# Patient Record
Sex: Male | Born: 1940
Health system: Southern US, Community
[De-identification: ages and names within clinical notes are randomized; demographics above are authoritative.]

## PROBLEM LIST (undated history)

## (undated) DIAGNOSIS — H269 Unspecified cataract: Secondary | ICD-10-CM

## (undated) DIAGNOSIS — E7800 Pure hypercholesterolemia, unspecified: Secondary | ICD-10-CM

## (undated) DIAGNOSIS — C61 Malignant neoplasm of prostate: Secondary | ICD-10-CM

## (undated) DIAGNOSIS — Z974 Presence of external hearing-aid: Secondary | ICD-10-CM

## (undated) DIAGNOSIS — D126 Benign neoplasm of colon, unspecified: Secondary | ICD-10-CM

## (undated) HISTORY — PX: PROSTATE SURGERY: SHX751

## (undated) HISTORY — PX: COLONOSCOPY: SHX174

## (undated) HISTORY — DX: Presence of external hearing-aid: Z97.4

## (undated) HISTORY — DX: Pure hypercholesterolemia, unspecified: E78.00

## (undated) HISTORY — DX: Benign neoplasm of colon, unspecified: D12.6

## (undated) HISTORY — PX: PROSTATE BIOPSY: SHX241

## (undated) HISTORY — PX: TONSILLECTOMY AND ADENOIDECTOMY: SUR1326

## (undated) HISTORY — PX: APPENDECTOMY: SHX54

## (undated) HISTORY — DX: Unspecified cataract: H26.9

## (undated) HISTORY — PX: CATARACT EXTRACTION: SUR2

## (undated) HISTORY — DX: Malignant neoplasm of prostate: C61

## (undated) HISTORY — PX: VASECTOMY: SHX75

---

## 1998-12-11 HISTORY — PX: LEG WOUND REPAIR / CLOSURE: SUR1143

## 2006-03-29 ENCOUNTER — Ambulatory Visit: Payer: Self-pay | Admitting: Gastroenterology

## 2006-04-12 ENCOUNTER — Encounter (INDEPENDENT_AMBULATORY_CARE_PROVIDER_SITE_OTHER): Payer: Self-pay | Admitting: Specialist

## 2006-04-12 ENCOUNTER — Ambulatory Visit: Payer: Self-pay | Admitting: Gastroenterology

## 2008-07-10 ENCOUNTER — Emergency Department (HOSPITAL_COMMUNITY): Admission: EM | Admit: 2008-07-10 | Discharge: 2008-07-10 | Payer: Self-pay | Admitting: Emergency Medicine

## 2008-07-17 ENCOUNTER — Encounter (HOSPITAL_COMMUNITY): Admission: RE | Admit: 2008-07-17 | Discharge: 2008-10-13 | Payer: Self-pay | Admitting: Emergency Medicine

## 2009-03-10 ENCOUNTER — Emergency Department (HOSPITAL_COMMUNITY): Admission: EM | Admit: 2009-03-10 | Discharge: 2009-03-10 | Payer: Self-pay | Admitting: Emergency Medicine

## 2009-03-22 ENCOUNTER — Encounter (INDEPENDENT_AMBULATORY_CARE_PROVIDER_SITE_OTHER): Payer: Self-pay | Admitting: *Deleted

## 2009-12-08 ENCOUNTER — Ambulatory Visit (HOSPITAL_COMMUNITY): Admission: RE | Admit: 2009-12-08 | Discharge: 2009-12-08 | Payer: Self-pay | Admitting: Gastroenterology

## 2011-06-08 ENCOUNTER — Encounter: Payer: Self-pay | Admitting: Gastroenterology

## 2011-06-29 ENCOUNTER — Encounter: Payer: Self-pay | Admitting: Gastroenterology

## 2011-07-20 ENCOUNTER — Ambulatory Visit (INDEPENDENT_AMBULATORY_CARE_PROVIDER_SITE_OTHER): Payer: Medicare Other | Admitting: Sports Medicine

## 2011-07-20 VITALS — BP 118/78 | Ht 72.0 in | Wt 175.0 lb

## 2011-07-20 DIAGNOSIS — M25561 Pain in right knee: Secondary | ICD-10-CM

## 2011-07-20 DIAGNOSIS — M25569 Pain in unspecified knee: Secondary | ICD-10-CM

## 2011-07-20 DIAGNOSIS — IMO0002 Reserved for concepts with insufficient information to code with codable children: Secondary | ICD-10-CM

## 2011-07-20 DIAGNOSIS — S83249A Other tear of medial meniscus, current injury, unspecified knee, initial encounter: Secondary | ICD-10-CM

## 2011-07-20 NOTE — Progress Notes (Signed)
  Subjective:    Patient ID: Parker Harrison, male    DOB: 1941/01/31, 70 y.o.   MRN: [id number]  HPI  Pt presents to clinic for evaluation of rt posterior knee pain x 4 days.   Pain started during a tennis match when he reached for a ball and twisted knee. Does yoga 2-3 times per week, and plays tennis 2-3 times per week. Pain has improved significantly since Tuesday.   He did not have locking swelling or giving way   Review of Systems     Objective:   Physical Exam  NAD Left knee Lacks 3 deg of extension Marked med tibial condyle hypertrophy No clicking on rotation No pain with flexion pinch Crepitation under patella and lateral side with flexion  Rt knee Full extension Normal contour Positive flexion pinch on rt, but has full flexion Crepitation on both joint lines with McMurray's  Crepitation under patella with flexion  No pain with palpation of HS or gastroc tendons on rt  MSK ultrasound On the right knee at the posterior horn of the medial meniscus there is a very small tear visualized best from the posterior view but also visualized on the joint line medially near the posterior fossa The lateral meniscus appears normal The patellar tendon and quad tendon appear normal There is no effusion      Assessment & Plan:

## 2011-07-20 NOTE — Assessment & Plan Note (Signed)
He will start some biking 3 days a week for quad strengthening rehabilitation  Okay to do you ago but avoid positions that cause extreme knee flexion which irritates the area of his meniscus injury  For tennis over the next 6 weeks I think he should try a compression sleeve on his knee to see if it is more comfortable in this limits the swelling  Recheck in 6 weeks unless symptom-free

## 2011-07-20 NOTE — Assessment & Plan Note (Signed)
He can use over-the-counter medications as needed as his pain seems pretty mild  Use ice after activity

## 2011-08-02 ENCOUNTER — Ambulatory Visit (AMBULATORY_SURGERY_CENTER): Payer: Medicare Other | Admitting: *Deleted

## 2011-08-02 ENCOUNTER — Encounter: Payer: Self-pay | Admitting: Gastroenterology

## 2011-08-02 VITALS — Ht 72.0 in | Wt 175.0 lb

## 2011-08-02 DIAGNOSIS — Z1211 Encounter for screening for malignant neoplasm of colon: Secondary | ICD-10-CM

## 2011-08-02 MED ORDER — SUPREP BOWEL PREP KIT 17.5-3.13-1.6 GM/177ML PO SOLN: 1.0000 | Freq: Once | ORAL | Status: DC

## 2011-08-16 ENCOUNTER — Ambulatory Visit (INDEPENDENT_AMBULATORY_CARE_PROVIDER_SITE_OTHER): Payer: Medicare Other | Admitting: Sports Medicine

## 2011-08-16 ENCOUNTER — Other Ambulatory Visit: Payer: Self-pay | Admitting: Gastroenterology

## 2011-08-16 ENCOUNTER — Encounter: Payer: Self-pay | Admitting: Sports Medicine

## 2011-08-16 VITALS — BP 127/55 | HR 58

## 2011-08-16 DIAGNOSIS — M25561 Pain in right knee: Secondary | ICD-10-CM

## 2011-08-16 DIAGNOSIS — S76119A Strain of unspecified quadriceps muscle, fascia and tendon, initial encounter: Secondary | ICD-10-CM

## 2011-08-16 DIAGNOSIS — S83249A Other tear of medial meniscus, current injury, unspecified knee, initial encounter: Secondary | ICD-10-CM

## 2011-08-16 DIAGNOSIS — IMO0002 Reserved for concepts with insufficient information to code with codable children: Secondary | ICD-10-CM

## 2011-08-16 DIAGNOSIS — S86819A Strain of other muscle(s) and tendon(s) at lower leg level, unspecified leg, initial encounter: Secondary | ICD-10-CM

## 2011-08-16 DIAGNOSIS — S838X9A Sprain of other specified parts of unspecified knee, initial encounter: Secondary | ICD-10-CM

## 2011-08-16 DIAGNOSIS — M25569 Pain in unspecified knee: Secondary | ICD-10-CM

## 2011-08-16 MED ORDER — NITROGLYCERIN 0.2 MG/HR TD PT24: MEDICATED_PATCH | TRANSDERMAL | Status: DC

## 2011-08-16 NOTE — Patient Instructions (Signed)
Start using 1/4 nitroglycerin patch on right knee daily. Please avoid playing tennis for the next month. Use bodyhelix knee sleeve when doing activity. Ok to walk as much as desired. Start using stationary bike to strengthen quad muscles. Start doing suggested knee exercises daily.

## 2011-08-16 NOTE — Assessment & Plan Note (Signed)
This seems to have improved and he is having less symptoms  I would encourage waiting on tennis for another 4 weeks until we recheck him.

## 2011-08-16 NOTE — Assessment & Plan Note (Signed)
He seems stable with over-the-counter medications and will continue these as needed

## 2011-08-16 NOTE — Progress Notes (Signed)
  Subjective:    Patient ID: Parker Harrison, male    DOB: 23-Oct-1941, 70 y.o.   MRN: [id number]  HPI  Pt presents to clinic for f/u of rt knee pain which he reports is about 80% improved.  Has decreased activity since last visit. Played tennis once, and did not have pain. Wore small bodyhelix full knee sleeve. After playing did have a knot appear on anterolateral rt knee which is improving, but has remained swollen. This was not painful but first night swelling was significant and has persisted.        Review of Systems     Objective:   Physical Exam NAD  Lt knee medial joint line spurring, lacks 5 deg full extension, 145 deg of flexion Clicking with Mcmurray's medially and laterally on lt Rt knee 135 deg flexion, -3 deg extension Lateral click on Mcmurray's on rt Less medial joint line hypertrophy on rt In seated position has swollen area 2x2 in at Rt distal vastus lateralis tendon just above patella Suprapatellar pouch slt swollen thessaly neg  MSK [place] Had stump sign and significant swelling of pouch under the vast lateralis on RT supratellar swelling crosses to midline and less on medial aspect Medial meniscus which showed some hypoechoic change and calcification in mid post are last visit looks more normal Post medial meniscus still shows some irregularity     Assessment & Plan:

## 2011-08-16 NOTE — Assessment & Plan Note (Addendum)
Start basic quad rehab biking or stationary biking 3 x week Walking OK qod Use compression sleeve - given medium

## 2011-09-14 ENCOUNTER — Ambulatory Visit (INDEPENDENT_AMBULATORY_CARE_PROVIDER_SITE_OTHER): Payer: Medicare Other | Admitting: Sports Medicine

## 2011-09-14 VITALS — BP 108/70

## 2011-09-14 DIAGNOSIS — M25561 Pain in right knee: Secondary | ICD-10-CM

## 2011-09-14 DIAGNOSIS — M25569 Pain in unspecified knee: Secondary | ICD-10-CM

## 2011-09-14 DIAGNOSIS — IMO0002 Reserved for concepts with insufficient information to code with codable children: Secondary | ICD-10-CM

## 2011-09-14 DIAGNOSIS — S83249A Other tear of medial meniscus, current injury, unspecified knee, initial encounter: Secondary | ICD-10-CM

## 2011-09-14 NOTE — Progress Notes (Signed)
  Subjective:    Patient ID: Parker Harrison, male    DOB: [birth year], 70 y.o.   MRN: [id number]  HPI  Parker Harrison is coming today to F/u on his R knee pain. He is 95% better. No pain with resting, doing yoga and spining without no pain. He has not played tennis yet. He has been using the nitro patch for 1 month without any side effects. Her denies any mechanical symptoms a s catching, locking or giving away.  Patient Active Problem List  Diagnoses  . Knee pain, right  . Tear of meniscus, knee, medial  . Quadriceps tendon rupture   Current Outpatient Prescriptions on File Prior to Visit  Medication Sig Dispense Refill  . glucosamine-chondroitin 500-400 MG tablet Take 2 tablets by mouth daily.        . nitroGLYCERIN (NITRODUR - DOSED IN MG/24 HR) 0.2 mg/hr Apply 1/4 patch to affected area daily.  Change patch every 24 hours.  30 patch  2  . SUPREP BOWEL PREP SOLN Take 1 kit by mouth once.  354 mL  0   No Known Allergies     Review of Systems  Constitutional: Negative for fever, chills, diaphoresis and fatigue.  Musculoskeletal: Negative for back pain, joint swelling and gait problem.  Neurological: Negative for tremors, weakness and numbness.       Objective:   Physical Exam  Constitutional: He is oriented to person, place, and time. He appears well-developed and well-nourished.       BP 108/70   Pulmonary/Chest: Effort normal.  Musculoskeletal:       Right knee with intact skin, FROM. Patellofemoral crepitus present with flexion and extension. Patellofemoral compression  test +. No tenderness on the quad neither or patellar tendon. TTP in the mid joint line. Ligaments intact. Lachman neg. Varus and valgus test at 0 and 30 degres neg Poor quad muscle definition.  Tight hamstrings Normal gait without a limp. Feet with mid arch.    Neurological: He is alert and oriented to person, place, and time.  Skin: Skin is warm. No rash noted. No erythema. No pallor.  Psychiatric: He  has a normal mood and affect. His behavior is normal.      MSK U/S : calcification of medial meniscus. Small joint effusion. Quad tendon looks wnl.     Assessment & Plan:    1. Tear of meniscus, knee, medial   2. Knee pain, right    Cont Nitro patch Quad exercise. Use knee sleeve playing tennis Start doing hitting drills in tennis F/U in 2 month

## 2011-09-19 ENCOUNTER — Encounter: Payer: Self-pay | Admitting: Gastroenterology

## 2011-09-19 ENCOUNTER — Ambulatory Visit (AMBULATORY_SURGERY_CENTER): Payer: Medicare Other | Admitting: Gastroenterology

## 2011-09-19 VITALS — BP 126/72 | HR 49 | Temp 96.6°F | Resp 20 | Ht 72.0 in | Wt 175.0 lb

## 2011-09-19 DIAGNOSIS — Z860100 Personal history of colon polyps, unspecified: Secondary | ICD-10-CM

## 2011-09-19 DIAGNOSIS — Z1211 Encounter for screening for malignant neoplasm of colon: Secondary | ICD-10-CM

## 2011-09-19 MED ORDER — SODIUM CHLORIDE 0.9 % IV SOLN: 500.0000 mL | INTRAVENOUS | Status: DC

## 2011-09-19 NOTE — Progress Notes (Signed)
Pt was uncomfortable with the scope advancement.  1mg  of versed given IV.  Pt's blood pressure 87/53 noted. Pt W,D,P and R.  Iv fliud wide open drip.  Maw   Blood pressure checked 106/69. VSS. maw

## 2011-09-19 NOTE — Patient Instructions (Signed)
RESUME ALL MEDICATIONS. D/C INFORMATION COMPLETED.

## 2011-09-20 ENCOUNTER — Telehealth: Payer: Self-pay | Admitting: *Deleted

## 2011-09-20 NOTE — Telephone Encounter (Signed)

## 2011-11-14 ENCOUNTER — Ambulatory Visit (INDEPENDENT_AMBULATORY_CARE_PROVIDER_SITE_OTHER): Payer: Medicare Other | Admitting: Sports Medicine

## 2011-11-14 DIAGNOSIS — M629 Disorder of muscle, unspecified: Secondary | ICD-10-CM

## 2011-11-14 DIAGNOSIS — M6289 Other specified disorders of muscle: Secondary | ICD-10-CM | POA: Insufficient documentation

## 2011-11-14 DIAGNOSIS — IMO0002 Reserved for concepts with insufficient information to code with codable children: Secondary | ICD-10-CM

## 2011-11-14 DIAGNOSIS — S83249A Other tear of medial meniscus, current injury, unspecified knee, initial encounter: Secondary | ICD-10-CM

## 2011-11-14 MED ORDER — NITROGLYCERIN 0.2 MG/HR TD PT24: MEDICATED_PATCH | TRANSDERMAL | Status: DC

## 2011-11-14 NOTE — Assessment & Plan Note (Signed)
Perform hamstring stretches.  Continue yoga

## 2011-11-14 NOTE — Patient Instructions (Signed)
Try a half patch on your right knee.  Work on hamstring mobility and stretching.  Your meniscus has healed up well, and may not get better than it is now.

## 2011-11-14 NOTE — Assessment & Plan Note (Signed)
Healing well. May be at maximum healing level. Continue yoga and other stretching exercises for hamstrings.

## 2011-11-14 NOTE — Progress Notes (Signed)
  Subjective:    Patient ID: Parker Harrison, male    DOB: Jun 07, 1941, 70 y.o.   MRN: [id number]  HPI  Mr.Crays is coming today to F/u on his R knee pain. He is 95% better. No pain with resting, doing yoga and body pump without pain. He is playing tennis. He has been using half of the nitro patch for 1 month without any side effects. He denies any mechanical symptoms such as catching, locking or giving out.  Patient Active Problem List  Diagnoses  . Knee pain, right  . Tear of meniscus, knee, medial  . Quadriceps tendon rupture  . Hamstring tightness   Current Outpatient Prescriptions on File Prior to Visit  Medication Sig Dispense Refill  . glucosamine-chondroitin 500-400 MG tablet Take 2 tablets by mouth daily.         No Known Allergies  Review of Systems  Constitutional: Negative for fever, chills, diaphoresis and fatigue.  Musculoskeletal: Negative for back pain, joint swelling and gait problem.  Neurological: Negative for tremors, weakness and numbness.      Objective:   Physical Exam  Constitutional: He is oriented to person, place, and time. He appears well-developed and well-nourished.       BP 108/70   Pulmonary/Chest: Effort normal.  Musculoskeletal:       Right knee with intact skin, FROM. Patellofemoral crepitus present with flexion and extension. Patellofemoral compression  test +. No tenderness on the quad neither or patellar tendon. TTP in the mid joint line. Ligaments intact. Lachman neg. Varus and valgus test at 0 and 30 degres neg Poor quad muscle definition.  Tight hamstrings Normal gait without a limp. Feet with mid arch.    Neurological: He is alert and oriented to person, place, and time.  Skin: Skin is warm. No rash noted. No erythema. No pallor.  Psychiatric: He has a normal mood and affect. His behavior is normal.   MSK U/S 10/12 : calcification of medial meniscus. Small joint effusion. Quad tendon looks wnl.     Assessment & Plan:   1. Tear  of meniscus, knee, medial   2. Hamstring tightness    Cont Nitro patch Quad exercise. Use knee sleeve playing tennis Start doing hitting drills in tennis F/U prn

## 2011-11-30 ENCOUNTER — Ambulatory Visit: Payer: Self-pay

## 2012-02-12 ENCOUNTER — Ambulatory Visit (INDEPENDENT_AMBULATORY_CARE_PROVIDER_SITE_OTHER): Payer: Medicare Other | Admitting: Sports Medicine

## 2012-02-12 VITALS — BP 120/70

## 2012-02-12 DIAGNOSIS — IMO0002 Reserved for concepts with insufficient information to code with codable children: Secondary | ICD-10-CM

## 2012-02-12 DIAGNOSIS — S83249A Other tear of medial meniscus, current injury, unspecified knee, initial encounter: Secondary | ICD-10-CM

## 2012-02-12 DIAGNOSIS — M6289 Other specified disorders of muscle: Secondary | ICD-10-CM

## 2012-02-12 DIAGNOSIS — M629 Disorder of muscle, unspecified: Secondary | ICD-10-CM

## 2012-02-12 NOTE — Assessment & Plan Note (Signed)
This is stable  He can follow up prn for this  Playing tennis w no knee pain

## 2012-02-12 NOTE — Progress Notes (Signed)
  Subjective:    Patient ID: Parker Harrison, male    DOB: [birth year], 71 y.o.   MRN: [id number]  HPI  71 y/o male is here for follow up for right knee pain secondary to a calcified meniscus.  This pain is completely resolved.  He is also following up for hamstring pain and tightness.  He has no problems with his hamstring except when playing tennis.  He can do group exercise classes without difficulty but on the tennis court he feels like he doesn't have a spring in his step.  He gets pain at the distal hamstring during the game that is worse with flexion.   Review of Systems     Objective:   Physical Exam  Hips pain equal free rotation bilaterally  The right hamstring tendon is more taught than the left  when he is lying prone Hamstring strength is normal with foot in neutral, everted, and inverted and there is no pain with resistance No tenderness to palpation at the proximal or distal hamstring tendons No tenderness to palpation in the muscle belly No masses       Assessment & Plan:

## 2012-02-12 NOTE — Assessment & Plan Note (Signed)
Given improved stretching regimen by Dr Laural Benes He was not doing these correctly  He will work daily stretching Plans to try rolfing with Cammy Copa Use knee sleeve over area of HS tightness  Reck pending response to this approach

## 2012-03-18 ENCOUNTER — Other Ambulatory Visit: Payer: Self-pay | Admitting: Dermatology

## 2012-08-01 ENCOUNTER — Encounter: Payer: Self-pay | Admitting: Family Medicine

## 2012-08-01 ENCOUNTER — Ambulatory Visit (INDEPENDENT_AMBULATORY_CARE_PROVIDER_SITE_OTHER): Payer: Medicare Other | Admitting: Family Medicine

## 2012-08-01 VITALS — BP 113/68 | HR 63 | Ht 72.0 in | Wt 180.0 lb

## 2012-08-01 DIAGNOSIS — M25562 Pain in left knee: Secondary | ICD-10-CM

## 2012-08-01 DIAGNOSIS — M25569 Pain in unspecified knee: Secondary | ICD-10-CM

## 2012-08-05 ENCOUNTER — Encounter: Payer: Self-pay | Admitting: Family Medicine

## 2012-08-05 DIAGNOSIS — M25562 Pain in left knee: Secondary | ICD-10-CM | POA: Insufficient documentation

## 2012-08-05 NOTE — Assessment & Plan Note (Signed)
Patient's knee exam is benign.  All meniscus testing negative.  He describes knee locking in full extension, NOT locking in flexion as would be expected with a loose body or bucket handle type meniscal tear.  Most likely due to popliteal spasms but discussed if this does not improving with ROM exercises/stretches, nsaids would advise returning for reevaluation for possible MRI to assess for meniscal tear causing mechanical symptoms.

## 2012-08-05 NOTE — Progress Notes (Signed)
  Subjective:    Patient ID: Parker Harrison, male    DOB: May 06, 1941, 71 y.o.   MRN: [id number]  PCP: Dr. Perrin Maltese  HPI 71 yo M here for left knee pain.  Patient reports this morning while doing yoga his left knee felt like it locked in extension. Now feels like he can only bend about 90 degrees. No swelling or bruising. Just started today while in downward dog position. Took aleve earlier which helped. No prior locking, catching, giving out. Injured this knee 12+ years ago when had a laceration. Seen in [place] office for right knee pain issues 2/2 calcified meniscus.  Past Medical History  Diagnosis Date  . Adenomatous polyp of colon   . Hearing aid worn     No current outpatient prescriptions on file prior to visit.    Past Surgical History  Procedure Date  . Tonsillectomy and adenoidectomy   . Colonoscopy     No Known Allergies  History   Social History  . Marital Status: Married    Spouse Name: N/A    Number of Children: N/A  . Years of Education: N/A   Occupational History  . Not on file.   Social History Main Topics  . Smoking status: Never Smoker   . Smokeless tobacco: Never Used  . Alcohol Use: 4.8 oz/week    8 Glasses of wine per week  . Drug Use: No  . Sexually Active: Not on file   Other Topics Concern  . Not on file   Social History Narrative  . No narrative on file    Family History  Problem Relation Age of Onset  . Sudden death Neg Hx   . Hypertension Neg Hx   . Hyperlipidemia Neg Hx   . Heart attack Neg Hx   . Diabetes Neg Hx     BP 113/68  Pulse 63  Ht 6' (1.829 m)  Wt 180 lb (81.647 kg)  BMI 24.41 kg/m2  Review of Systems See HPI above.    Objective:   Physical Exam Gen: NAD  L knee: No gross deformity, ecchymoses, effusion. No TTP. ROM 0 -100 comfortably. Negative ant/post drawers. Negative valgus/varus testing. Negative lachmanns. Negative mcmurrays, apleys, patellar apprehension, clarkes, thessalys,  sit-home. NV intact distally.  R knee: FROM without pain, instability.    Assessment & Plan:  1. Left knee pain - Patient's knee exam is benign.  All meniscus testing negative.  He describes knee locking in full extension, NOT locking in flexion as would be expected with a loose body or bucket handle type meniscal tear.  Most likely due to popliteal spasms but discussed if this does not improving with ROM exercises/stretches, nsaids would advise returning for reevaluation for possible MRI to assess for meniscal tear causing mechanical symptoms.

## 2012-09-23 ENCOUNTER — Other Ambulatory Visit: Payer: Self-pay | Admitting: Dermatology

## 2012-11-22 ENCOUNTER — Encounter: Payer: Self-pay | Admitting: Family Medicine

## 2012-11-22 ENCOUNTER — Ambulatory Visit (INDEPENDENT_AMBULATORY_CARE_PROVIDER_SITE_OTHER): Payer: Medicare Other | Admitting: Family Medicine

## 2012-11-22 VITALS — BP 135/76 | HR 53 | Temp 97.6°F | Resp 16 | Ht 71.5 in | Wt 183.0 lb

## 2012-11-22 DIAGNOSIS — Z860100 Personal history of colon polyps, unspecified: Secondary | ICD-10-CM | POA: Insufficient documentation

## 2012-11-22 DIAGNOSIS — Z0000 Encounter for general adult medical examination without abnormal findings: Secondary | ICD-10-CM

## 2012-11-22 DIAGNOSIS — Z23 Encounter for immunization: Secondary | ICD-10-CM

## 2012-11-22 DIAGNOSIS — Z125 Encounter for screening for malignant neoplasm of prostate: Secondary | ICD-10-CM

## 2012-11-22 LAB — POCT URINALYSIS DIPSTICK
Ketones, UA: NEGATIVE
Leukocytes, UA: NEGATIVE
Protein, UA: NEGATIVE
pH, UA: 5.5

## 2012-11-22 LAB — COMPREHENSIVE METABOLIC PANEL
ALT: 25 U/L (ref 0–53)
BUN: 18 mg/dL (ref 6–23)
CO2: 28 mEq/L (ref 19–32)
Calcium: 10 mg/dL (ref 8.4–10.5)
Creat: 1.01 mg/dL (ref 0.50–1.35)
Total Bilirubin: 0.8 mg/dL (ref 0.3–1.2)

## 2012-11-22 LAB — LIPID PANEL
Cholesterol: 256 mg/dL — ABNORMAL HIGH (ref 0–200)
HDL: 60 mg/dL (ref >39)
Total CHOL/HDL Ratio: 4.3 Ratio
Triglycerides: 82 mg/dL (ref <150)
VLDL: 16 mg/dL (ref 0–40)

## 2012-11-22 NOTE — Progress Notes (Signed)
Subjective:    Patient ID: Parker Harrison, male    DOB: [birth year], 71 y.o.   MRN: [id number]  HPI  This healthy 71 y.o. Cauc male is here for annual [facility] Wellness exam. He has minimal  physical complaints and stays physically active w/ tennis and other activities. He stays sharp  mentally by playing bridge.    Review of Systems  HENT: Positive for hearing loss.        Pt has bilat hearing aids.  All other systems reviewed and are negative.       Objective:   Physical Exam  Nursing note and vitals reviewed. Constitutional: He is oriented to person, place, and time. He appears well-developed and well-nourished. No distress.  HENT:  Head: Normocephalic and atraumatic.  Right Ear: Tympanic membrane, external ear and ear canal normal. Decreased hearing is noted.  Left Ear: Tympanic membrane, external ear and ear canal normal. Decreased hearing is noted.  Nose: No mucosal edema, rhinorrhea, nasal deformity or septal deviation. Right sinus exhibits no maxillary sinus tenderness and no frontal sinus tenderness. Left sinus exhibits no maxillary sinus tenderness and no frontal sinus tenderness.  Mouth/Throat: Uvula is midline, oropharynx is clear and moist and mucous membranes are normal. No oral lesions. Normal dentition. No dental caries.  Eyes: Conjunctivae normal, EOM and lids are normal. Pupils are equal, round, and reactive to light. No scleral icterus.  Fundoscopic exam:      The right eye shows no papilledema. The right eye shows red reflex.      The left eye shows no papilledema. The left eye shows red reflex.      Vision care by eye care professional.  Neck: Normal range of motion. Neck supple. No JVD present. No thyromegaly present.  Cardiovascular: Normal rate, regular rhythm, normal heart sounds and intact distal pulses.  Exam reveals no gallop and no friction rub.   No murmur heard. Pulmonary/Chest: Effort normal and breath sounds normal. No respiratory distress. He has no  wheezes. He exhibits no tenderness.  Abdominal: Soft. Bowel sounds are normal. He exhibits no distension, no abdominal bruit, no pulsatile midline mass and no mass. There is no hepatosplenomegaly. There is no tenderness. There is no guarding and no CVA tenderness. No hernia. Hernia confirmed negative in the right inguinal area and confirmed negative in the left inguinal area.  Genitourinary: Testes normal and penis normal. Rectal exam shows external hemorrhoid. Rectal exam shows no fissure, no mass, no tenderness and anal tone normal. Guaiac negative stool. Prostate is enlarged. Prostate is not tender. Right testis shows no mass, no swelling and no tenderness. Right testis is descended. Left testis shows no mass, no swelling and no tenderness. Left testis is descended.       Prostate gland with mild enlargement of right lobe; firm consistency but no discrete nodule palpable.  Musculoskeletal: Normal range of motion. He exhibits no edema and no tenderness.  Lymphadenopathy:    He has no cervical adenopathy.       Right: No inguinal adenopathy present.       Left: No inguinal adenopathy present.  Neurological: He is alert and oriented to person, place, and time. He has normal reflexes. No cranial nerve deficit. He exhibits normal muscle tone. Coordination normal.  Skin: Skin is warm and dry. No rash noted. No erythema. No pallor.  Psychiatric: He has a normal mood and affect. His behavior is normal. Judgment and thought content normal.  Assessment & Plan:   1. Routine general medical examination at a health care facility  POCT urinalysis dipstick Comprehensive metabolic panel, Lipid panel, IFOBT POC (occult bld, rslt in office) Discussed treatment of elevated cholesterol (pt's preference is not to take statins but to practice healthy nutrition and lifestyle).  2. Special screening for malignant neoplasm of prostate  PSA, Medicare  3. Personal history of colonic polyps  CRS due in 2017.   4. Need for influenza vaccination  Flu vaccine greater than or equal to 3yo preservative free IM

## 2012-11-22 NOTE — Patient Instructions (Addendum)
Keeping you healthy  Get these tests  Blood pressure- Have your blood pressure checked once a year by your healthcare provider.  Normal blood pressure is 120/80  Weight- Have your body mass index (BMI) calculated to screen for obesity.  BMI is a measure of body fat based on height and weight. You can also calculate your own BMI at [email or web address].  Cholesterol- Have your cholesterol checked every year.  Diabetes- Have your blood sugar checked regularly if you have high blood pressure, high cholesterol, have a family history of diabetes or if you are overweight.  Screening for Colon Cancer- Colonoscopy starting at age 82.  Screening may begin sooner depending on your family history and other health conditions. Follow up colonoscopy as directed by your Gastroenterologist.  Screening for Prostate Cancer- Both blood work (PSA) and a rectal exam help screen for Prostate Cancer.  Screening begins at age 37 with African-American men and at age 22 with Caucasian men.  Screening may begin sooner depending on your family history.  Take these medicines  Aspirin- One aspirin daily can help prevent Heart disease and Stroke.  Flu shot- Every fall.  Tetanus- Every 10 years.  Tdap is up-to-date- next Tetanus is due in 2017.  Zostavax- Once after the age of 55 to prevent Shingles. You had this vaccine on 12/20/2010.  Pneumonia shot- Once after the age of 40; if you are younger than 1, ask your healthcare provider if you need a Pneumonia shot.  You had this vaccine on 07/20/2008.  Take these steps  Don't smoke- If you do smoke, talk to your doctor about quitting.  For tips on how to quit, go to www.smokefree.gov or call 1-800-QUIT-NOW.  Be physically active- Exercise 5 days a week for at least 30 minutes.  If you are not already physically active start slow and gradually work up to 30 minutes of moderate physical activity.  Examples of moderate activity include walking briskly, mowing the yard,  dancing, swimming, bicycling, etc.  Eat a healthy diet- Eat a variety of healthy food such as fruits, vegetables, low fat milk, low fat cheese, yogurt, lean meant, poultry, fish, beans, tofu, etc. For more information go to www.thenutritionsource.org  Drink alcohol in moderation- Limit alcohol intake to less than two drinks a day. Never drink and drive.  Dentist- Brush and floss twice daily; visit your dentist twice a year.  Depression- Your emotional health is as important as your physical health. If you're feeling down, or losing interest in things you would normally enjoy please talk to your healthcare provider.  Eye exam- Visit your eye doctor every year.  Safe sex- If you may be exposed to a sexually transmitted infection, use a condom.  Seat belts- Seat belts can save your life; always wear one.  Smoke/Carbon Monoxide detectors- These detectors need to be installed on the appropriate level of your home.  Replace batteries at least once a year.  Skin cancer- When out in the sun, cover up and use sunscreen 15 SPF or higher.  Violence- If anyone is threatening you, please tell your healthcare provider.  Living Will/ Health care power of attorney- Speak with your healthcare provider and family.   There is some blood in your urine but this has been evaluated in the past.  Some nutrition additions that would be beneficial_  A good multi-vitamin like Centrum Silver with Vitamin D. Add some other nuts like pumpkin seeds and almonds to your daily snacking routine. Stay mentally and physically active and you  will continue to do well!!!    You are doing a great job staying healthy! Thank you!!!

## 2012-11-24 ENCOUNTER — Encounter: Payer: Self-pay | Admitting: Family Medicine

## 2012-11-24 DIAGNOSIS — E7800 Pure hypercholesterolemia, unspecified: Secondary | ICD-10-CM | POA: Insufficient documentation

## 2012-11-26 ENCOUNTER — Other Ambulatory Visit: Payer: Self-pay | Admitting: Family Medicine

## 2012-11-26 ENCOUNTER — Encounter: Payer: Self-pay | Admitting: Family Medicine

## 2012-11-26 DIAGNOSIS — R972 Elevated prostate specific antigen [PSA]: Secondary | ICD-10-CM

## 2012-11-26 NOTE — Telephone Encounter (Signed)
I placed an order for this pt to see Dr. Alfredo Martinez at [facility] re: elevated PSA; the pt requests referral to Dr. Marcine Matar at [facility]. Please make that change. Thank you.

## 2013-02-18 ENCOUNTER — Encounter: Payer: Self-pay | Admitting: Family Medicine

## 2013-02-18 DIAGNOSIS — C61 Malignant neoplasm of prostate: Secondary | ICD-10-CM | POA: Insufficient documentation

## 2013-03-20 ENCOUNTER — Ambulatory Visit: Payer: Medicare Other

## 2013-03-20 ENCOUNTER — Ambulatory Visit: Payer: Medicare Other | Admitting: Radiation Oncology

## 2013-03-26 ENCOUNTER — Encounter: Payer: Self-pay | Admitting: Oncology

## 2013-03-27 ENCOUNTER — Telehealth: Payer: Self-pay | Admitting: Radiation Oncology

## 2013-03-27 ENCOUNTER — Ambulatory Visit
Admission: RE | Admit: 2013-03-27 | Discharge: 2013-03-27 | Disposition: A | Discharge status: Home / Self Care | Payer: Medicare Other | Source: Ambulatory Visit | Attending: Radiation Oncology | Admitting: Radiation Oncology

## 2013-03-27 ENCOUNTER — Encounter: Payer: Self-pay | Admitting: Radiation Oncology

## 2013-03-27 VITALS — BP 127/65 | HR 71 | Temp 97.7°F | Ht 71.5 in | Wt 181.9 lb

## 2013-03-27 DIAGNOSIS — C61 Malignant neoplasm of prostate: Secondary | ICD-10-CM | POA: Insufficient documentation

## 2013-03-27 DIAGNOSIS — Z8042 Family history of malignant neoplasm of prostate: Secondary | ICD-10-CM | POA: Insufficient documentation

## 2013-03-27 DIAGNOSIS — E7800 Pure hypercholesterolemia, unspecified: Secondary | ICD-10-CM | POA: Insufficient documentation

## 2013-03-27 NOTE — Progress Notes (Signed)
GU Location of Tumor / Histology: prostate cancer   If Prostate Cancer, Gleason Score is (3 + 3=6) and PSA is (5.07) and prostate volume is 29.37 cc   Patient presented 4  months ago with signs/symptoms of elevated PSA  Biopsies of prostate (if applicable) revealed: adenocarcinoma of the prostate 7 areas   Past/Anticipated interventions by urology, if any: prostate biopsy   Past/Anticipated interventions by medical oncology, if any: none   Weight changes, if any: none Bowel/Bladder complaints, if any: none Nausea/Vomiting, if any: none Pain issues, if any: none  SAFETY ISSUES:  Prior radiation? no  Pacemaker/ICD? no Possible current pregnancy? no  Is the patient on methotrexate? No  Current Complaints / other details: Parker Harrison here for consult with his wife.  He denies pain and fatigue.  He does stop and start several times when urinating.

## 2013-03-27 NOTE — Progress Notes (Signed)
[facility] Radiation Oncology NEW PATIENT EVALUATION  Name: Parker Harrison MRN: [id number]  Date:   03/27/2013           DOB: [birth year]  Status: outpatient   CC: GUEST, Loretha Stapler, MD  Marcine Matar, MD    REFERRING PHYSICIAN: Marcine Matar, MD   DIAGNOSIS: Stage TI C. favorable risk adenocarcinoma prostate   HISTORY OF PRESENT ILLNESS:  Parker Harrison is a 72 y.o. male who is seen today for the courtesy of Dr. Retta Diones for discussion of possible radiation therapy in the management of his stage TI C. favorable risk adenocarcinoma prostate. He has had a slowly rising PSA since January 2002. He had been undergoing screening because of a positive family history of prostate cancer in his father and brother. His PSA in July 2010 was 2.7, November of 2001, 3.32, November 2012, 3.97, and in December 2013,  5.07. He was seen by Dr. to her Dahlstedt who performed ultrasound-guided biopsies on 02/14/2013. He was found to have central biopsies containing Gleason 6 (3+3) adenocarcinoma. He had 5% of one core from right lateral base, less than 5% of one core from the right mid gland, 5% of one core from the left lateral base, 10% of one core from the left lateral mid gland, 30% of one core from the left mid gland, less than 5% of one core from left lateral apex and 10% of one core from left apex. His gland volume was approximately 29.4 cc. He is doing well from a GU and GI standpoint. His I PSS score 6. He is potent.  PREVIOUS RADIATION THERAPY: No   PAST MEDICAL HISTORY:  has a past medical history of Adenomatous polyp of colon; Hearing aid worn; Hypercholesterolemia; Prostate cancer; and Elevated PSA (11/22/2012).     PAST SURGICAL HISTORY:  Past Surgical History  Procedure Laterality Date  . Tonsillectomy and adenoidectomy    . Colonoscopy    . Vasectomy    . Knee surgery Left   . Leg wound repair / closure Left   . Prostate biopsy       FAMILY HISTORY: family  history includes Dementia in his mother and Prostate cancer in his brother and father.  There is no history of Sudden death, and Hypertension, and Hyperlipidemia, and Heart attack, and Diabetes, . His father had prostate cancer and died at age 61. His mother died from "old age" at 16. One brother was diagnosed with prostate cancer in his 48s.   SOCIAL HISTORY:  reports that he has never smoked. He has never used smokeless tobacco. He reports that he drinks about 2.4 ounces of alcohol per week. He reports that he does not use illicit drugs. Married, 2 children. 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   ALLERGIES: Review of patient's allergies indicates no known allergies.   MEDICATIONS:  Current Outpatient Prescriptions  Medication Sig Dispense Refill  . naproxen sodium (ANAPROX) 220 MG tablet Take 220 mg by mouth 2 (two) times daily with a meal.       No current facility-administered medications for this encounter.     REVIEW OF SYSTEMS:  Pertinent items are noted in HPI.    PHYSICAL EXAM:  height is 5' 11.5" (1.816 m) and weight is 181 lb 14.4 oz (82.509 kg). His temperature is 97.7 F (36.5 C). His blood pressure is 127/65 and his pulse is 71.   Alert, oriented, well-developed 72 year old white male appearing much younger  than his stated age. Head and neck examination: Grossly unremarkable. Nodes: Without palpable cervical or supraclavicular lymphadenopathy. Chest: Lungs clear. Back: Without spinal or CVA tenderness. Abdomen: Without masses organomegaly. Genitalia: Unremarkable to inspection. Rectal: His prostate gland is normal in size and is without focal induration or nodularity. Extremities without edema. Neurologic examination: Rosen nonfocal.   LABORATORY DATA:  No results found for this basename: WBC, HGB, HCT, MCV, PLT   Lab Results  Component Value Date   NA 137 11/22/2012   K 4.3 11/22/2012   CL 101 11/22/2012   CO2 28 11/22/2012    Lab Results  Component Value Date   ALT 25 11/22/2012   AST 26 11/22/2012   ALKPHOS 76 11/22/2012   BILITOT 0.8 11/22/2012   PSA 5.07 from 11/22/2012.   IMPRESSION: Stage TI C. favorable risk adenocarcinoma prostate. I explained to the patient and his wife that his prognosis related to his stage, PSA level, and Gleason score. All are favorable. Management options include a robotic prostatectomy, close surveillance, or radiation therapy. Radiation therapy options include seed implantation or 8 weeks of external beam/IMRT. He is rather fit, and there is longevity in his family. I believe that he would be an excellent candidate for either surgery, based on his young physiologic age, or radiation therapy. We discussed seed implantation and also IMRT in great detail. We discussed the potential acute and late toxicities of radiation therapy. His prognosis is excellent. He may want to speak with one of the robotic surgeons if he is undecided about radiation therapy. He'll think things over and get back in touch with me if he would like to consider either radiation therapy option. He would be an ideal candidate for seed implantation.   PLAN: As discussed above.   I spent 60 minutes minutes face to face with the patient and more than 50% of that time was spent in counseling and/or coordination of care.

## 2013-03-27 NOTE — Progress Notes (Signed)
Please see the Nurse Progress Note in the MD Initial Consult Encounter for this patient. 

## 2013-03-27 NOTE — Telephone Encounter (Signed)
Met w patient today to discuss RO billing. Pt wanted to know the pricing of the prostate surgery and prostate seed implant and was given a rough estimate of $20 to $25 thousand dollars.   Pt has [facility] plan and as of date has met the following below:  OOP Max $4,900.00  Met YTD $211.77  Remaining BAL $0,981.19  Attending Rad:  RM

## 2013-03-28 ENCOUNTER — Telehealth: Payer: Self-pay | Admitting: *Deleted

## 2013-03-28 NOTE — Addendum Note (Signed)
Encounter addended by: Cordia Miklos Mintz Abia Monaco, RN on: 03/28/2013  7:14 PM<BR>     Documentation filed: Charges VN

## 2013-03-28 NOTE — Telephone Encounter (Signed)
CALLED PATIENT TO INFORM OF IMPLANT DATE, SPOKE WITH PATIENT AND HE IS AWARE OF HIS IMPLANT DATE 

## 2013-04-01 ENCOUNTER — Ambulatory Visit: Payer: Medicare Other

## 2013-04-01 ENCOUNTER — Ambulatory Visit: Payer: Medicare Other | Admitting: Radiation Oncology

## 2013-04-02 ENCOUNTER — Telehealth: Payer: Self-pay | Admitting: *Deleted

## 2013-04-02 ENCOUNTER — Other Ambulatory Visit: Payer: Self-pay | Admitting: Radiation Oncology

## 2013-04-02 NOTE — Telephone Encounter (Signed)
CALLED PATIENT TO GIVE INFO., LVM FOR A RETURN CALL 

## 2013-04-02 NOTE — Telephone Encounter (Signed)
xxxx 

## 2013-04-04 ENCOUNTER — Telehealth: Payer: Self-pay | Admitting: *Deleted

## 2013-04-04 NOTE — Telephone Encounter (Signed)
Called patient to ask question, lvm for a return call 

## 2013-04-08 ENCOUNTER — Other Ambulatory Visit: Payer: Self-pay | Admitting: Urology

## 2013-04-09 ENCOUNTER — Telehealth: Payer: Self-pay | Admitting: *Deleted

## 2013-04-09 NOTE — Telephone Encounter (Signed)
CALLED PATIENT TO REMIND OF APPT. FOR 04-10-13, LVM FOR A RETURN CALL

## 2013-04-10 ENCOUNTER — Encounter (HOSPITAL_BASED_OUTPATIENT_CLINIC_OR_DEPARTMENT_OTHER)
Admission: RE | Admit: 2013-04-10 | Discharge: 2013-04-10 | Disposition: A | Discharge status: Home / Self Care | Payer: Medicare Other | Source: Ambulatory Visit | Attending: Urology | Admitting: Urology

## 2013-04-10 ENCOUNTER — Other Ambulatory Visit: Payer: Self-pay

## 2013-04-10 ENCOUNTER — Ambulatory Visit
Admission: RE | Admit: 2013-04-10 | Discharge: 2013-04-10 | Disposition: A | Discharge status: Home / Self Care | Payer: Medicare Other | Source: Ambulatory Visit | Attending: Radiation Oncology | Admitting: Radiation Oncology

## 2013-04-10 ENCOUNTER — Ambulatory Visit (HOSPITAL_BASED_OUTPATIENT_CLINIC_OR_DEPARTMENT_OTHER)
Admission: RE | Admit: 2013-04-10 | Discharge: 2013-04-10 | Disposition: A | Discharge status: Home / Self Care | Payer: Medicare Other | Source: Ambulatory Visit | Attending: Urology | Admitting: Urology

## 2013-04-10 DIAGNOSIS — C61 Malignant neoplasm of prostate: Secondary | ICD-10-CM | POA: Insufficient documentation

## 2013-04-10 DIAGNOSIS — Z01818 Encounter for other preprocedural examination: Secondary | ICD-10-CM | POA: Insufficient documentation

## 2013-04-10 DIAGNOSIS — R091 Pleurisy: Secondary | ICD-10-CM | POA: Insufficient documentation

## 2013-04-10 NOTE — Progress Notes (Signed)
   Complex simulation/treatment planning note: The patient was taken to the CT simulator. He was placed supine. His pelvis was scanned. The CT data set was sent to the treatment planning system. I contoured his prostate and projected the prostate over his pubic arch. The arch is open. His prostate volume is 34.4 cc with a prostatic length of 3.07 cm. I prescribing 14,500 cGy utilizing I-125 seeds. He is to be implanted with the [employer] system.

## 2013-05-14 ENCOUNTER — Encounter: Payer: Self-pay | Admitting: Internal Medicine

## 2013-05-21 ENCOUNTER — Telehealth: Payer: Self-pay | Admitting: *Deleted

## 2013-05-21 NOTE — Telephone Encounter (Signed)
Called patient to ask question, lvm for a return call 

## 2013-05-22 ENCOUNTER — Encounter (HOSPITAL_BASED_OUTPATIENT_CLINIC_OR_DEPARTMENT_OTHER): Payer: Self-pay | Admitting: *Deleted

## 2013-05-22 LAB — COMPREHENSIVE METABOLIC PANEL
ALT: 27 U/L (ref 0–53)
Albumin: 4.1 g/dL (ref 3.5–5.2)
Alkaline Phosphatase: 85 U/L (ref 39–117)
Calcium: 9.9 mg/dL (ref 8.4–10.5)
Potassium: 4.3 mEq/L (ref 3.5–5.1)
Sodium: 137 mEq/L (ref 135–145)
Total Protein: 7.6 g/dL (ref 6.0–8.3)

## 2013-05-22 LAB — CBC
MCHC: 34.1 g/dL (ref 30.0–36.0)
RDW: 12.8 % (ref 11.5–15.5)

## 2013-05-22 LAB — PROTIME-INR: Prothrombin Time: 12.4 seconds (ref 11.6–15.2)

## 2013-05-22 LAB — APTT: aPTT: 33 seconds (ref 24–37)

## 2013-05-22 NOTE — Progress Notes (Signed)
NPO AFTER MN. ARRIVES AT 0600. CURRENT LAB WORK DONE TODAY. CURRENT EKG AND CXR IN EPIC AND CHART. WILL DO FLEET ENEMA AM OF SURG

## 2013-05-28 ENCOUNTER — Telehealth: Payer: Self-pay | Admitting: *Deleted

## 2013-05-28 NOTE — H&P (Signed)
Urology History and Physical Exam  CC: Prostate cancer  HPI: 72 year old male presents for brachytherapy today. His history is as follows:   Because of an elevating PSA, which was 5.07 in late 2013, he underwent ultrasound and biopsy of his prostate on February 14, 2013. His prostatic volume was just under 30 mL. 7/12 cores came back with adenocarcinoma, all showing Gleason 3+3 pattern. 5 cores on the left were positive, 2 on the right. There were 2 cores on the right which showed atypia. No perineural invasion was noted.  He consulted with Dr. Dayton Scrape and he has chosen to have radiation therapy for his prostate cancer.  He denies significant lower urinary tract symptoms. He has very mild erectile dysfunction, uses occasional Viagra.   PMH: Past Medical History  Diagnosis Date  . Adenomatous polyp of colon   . Hearing aid worn     BILATERAL  . Hypercholesterolemia     NO MEDS  . Prostate cancer     PSH: Past Surgical History  Procedure Laterality Date  . Colonoscopy    . Leg wound repair / closure Left 2000    INJURY  . Prostate biopsy    . Tonsillectomy and adenoidectomy  AGE 59    Allergies: Not on File  Medications: No prescriptions prior to admission     Social History: History   Social History  . Marital Status: Married    Spouse Name: N/A    Number of Children: 2  . Years of Education: N/A   Occupational History  . retired    Social History Main Topics  . Smoking status: Never Smoker   . Smokeless tobacco: Never Used  . Alcohol Use: 3.6 oz/week    4 Glasses of wine, 2 Cans of beer per week  . Drug Use: No  . Sexually Active: Not on file   Other Topics Concern  . Not on file   Social History Narrative  . No narrative on file    Family History: Family History  Problem Relation Age of Onset  . Sudden death Neg Hx   . Hypertension Neg Hx   . Hyperlipidemia Neg Hx   . Heart attack Neg Hx   . Diabetes Neg Hx   . Prostate cancer Father   .  Prostate cancer Brother   . Dementia Mother     Review of Systems: Positive: N/A Negative:   A further 10 point review of systems was negative except what is listed in the HPI.  Physical Exam: @VITALS2 @ General: No acute distress.  Awake. Head:  Normocephalic.  Atraumatic. ENT:  EOMI.  Mucous membranes moist Neck:  Supple.  No lymphadenopathy. CV:  S1 present. S2 present. Regular rate. Pulmonary: Equal effort bilaterally.  Clear to auscultation bilaterally. Abdomen: Soft.  Non tender to palpation. Skin:  Normal turgor.  No visible rash. Extremity: No gross deformity of bilateral upper extremities.  No gross deformity of bilateral lower extremities. Neurologic: Alert. Appropriate mood.   Studies:  No results found for this basename: HGB, WBC, PLT,  in the last 72 hours  No results found for this basename: NA, K, CL, CO2, BUN, CREATININE, CALCIUM, MAGNESIUM, GFRNONAA, GFRAA,  in the last 72 hours   No results found for this basename: PT, INR, APTT,  in the last 72 hours   No components found with this basename: ABG,     Assessment:  Stage T1c Adenocarcinoma of the prostate  Plan: I-125 brachytherapy

## 2013-05-28 NOTE — Telephone Encounter (Signed)
CALLED PATIENT TO REMIND OF IMPLANT ON 05-29-13, SPOKE WITH PATIENT'S WIFE AND THEY ARE AWARE OF THIS IMPLANT

## 2013-05-28 NOTE — Anesthesia Preprocedure Evaluation (Addendum)
Anesthesia Evaluation  Patient identified by MRN, date of birth, ID band Patient awake    Reviewed: Allergy & Precautions, H&P , NPO status , Patient's Chart, lab work & pertinent test results  Airway Mallampati: II TM Distance: >3 FB Neck ROM: full    Dental no notable dental hx. (+) Teeth Intact and Dental Advisory Given   Pulmonary neg pulmonary ROS,  breath sounds clear to auscultation  Pulmonary exam normal       Cardiovascular Exercise Tolerance: Good negative cardio ROS  Rhythm:regular Rate:Normal     Neuro/Psych negative neurological ROS  negative psych ROS   GI/Hepatic negative GI ROS, Neg liver ROS,   Endo/Other  negative endocrine ROS  Renal/GU negative Renal ROS  negative genitourinary   Musculoskeletal   Abdominal   Peds  Hematology negative hematology ROS (+)   Anesthesia Other Findings   Reproductive/Obstetrics negative OB ROS                          Anesthesia Physical Anesthesia Plan  ASA: II  Anesthesia Plan: General   Post-op Pain Management:    Induction: Intravenous  Airway Management Planned: LMA  Additional Equipment:   Intra-op Plan:   Post-operative Plan:   Informed Consent: I have reviewed the patients History and Physical, chart, labs and discussed the procedure including the risks, benefits and alternatives for the proposed anesthesia with the patient or authorized representative who has indicated his/her understanding and acceptance.   Dental Advisory Given  Plan Discussed with: CRNA and Surgeon  Anesthesia Plan Comments:         Anesthesia Quick Evaluation

## 2013-05-29 ENCOUNTER — Ambulatory Visit (HOSPITAL_BASED_OUTPATIENT_CLINIC_OR_DEPARTMENT_OTHER): Payer: Medicare Other | Admitting: Anesthesiology

## 2013-05-29 ENCOUNTER — Ambulatory Visit (HOSPITAL_COMMUNITY): Payer: Medicare Other

## 2013-05-29 ENCOUNTER — Encounter (HOSPITAL_BASED_OUTPATIENT_CLINIC_OR_DEPARTMENT_OTHER): Payer: Self-pay | Admitting: *Deleted

## 2013-05-29 ENCOUNTER — Ambulatory Visit (HOSPITAL_BASED_OUTPATIENT_CLINIC_OR_DEPARTMENT_OTHER)
Admission: RE | Admit: 2013-05-29 | Discharge: 2013-05-29 | Disposition: A | Discharge status: Home / Self Care | Payer: Medicare Other | Source: Ambulatory Visit | Attending: Urology | Admitting: Urology

## 2013-05-29 ENCOUNTER — Encounter (HOSPITAL_BASED_OUTPATIENT_CLINIC_OR_DEPARTMENT_OTHER): Payer: Self-pay | Admitting: Anesthesiology

## 2013-05-29 ENCOUNTER — Encounter (HOSPITAL_BASED_OUTPATIENT_CLINIC_OR_DEPARTMENT_OTHER)
Admission: RE | Disposition: A | Discharge status: Home / Self Care | Payer: Self-pay | Source: Ambulatory Visit | Attending: Urology

## 2013-05-29 ENCOUNTER — Encounter: Payer: Self-pay | Admitting: Radiation Oncology

## 2013-05-29 DIAGNOSIS — Z860100 Personal history of colon polyps, unspecified: Secondary | ICD-10-CM | POA: Insufficient documentation

## 2013-05-29 DIAGNOSIS — E7800 Pure hypercholesterolemia, unspecified: Secondary | ICD-10-CM | POA: Insufficient documentation

## 2013-05-29 DIAGNOSIS — C61 Malignant neoplasm of prostate: Secondary | ICD-10-CM | POA: Insufficient documentation

## 2013-05-29 HISTORY — PX: RADIOACTIVE SEED IMPLANT: SHX5150

## 2013-05-29 SURGERY — INSERTION, RADIATION SOURCE, PROSTATE
Anesthesia: General | Site: Prostate | Wound class: Clean

## 2013-05-29 MED ORDER — CIPROFLOXACIN HCL 250 MG PO TABS: 250.0000 mg | ORAL_TABLET | Freq: Two times a day (BID) | ORAL | Status: DC

## 2013-05-29 MED ORDER — BELLADONNA ALKALOIDS-OPIUM 16.2-60 MG RE SUPP
RECTAL | Status: DC | PRN
  Administered 2013-05-29: 1 via RECTAL

## 2013-05-29 MED ORDER — DEXAMETHASONE SODIUM PHOSPHATE 4 MG/ML IJ SOLN
INTRAMUSCULAR | Status: DC | PRN
  Administered 2013-05-29: 8 mg via INTRAVENOUS

## 2013-05-29 MED ORDER — PROPOFOL 10 MG/ML IV BOLUS
INTRAVENOUS | Status: DC | PRN
  Administered 2013-05-29: 130 mg via INTRAVENOUS

## 2013-05-29 MED ORDER — FENTANYL CITRATE 0.05 MG/ML IJ SOLN
INTRAMUSCULAR | Status: DC | PRN
  Administered 2013-05-29: 25 ug via INTRAVENOUS
  Administered 2013-05-29: 50 ug via INTRAVENOUS
  Administered 2013-05-29: 25 ug via INTRAVENOUS
  Administered 2013-05-29 (×2): 50 ug via INTRAVENOUS

## 2013-05-29 MED ORDER — LIDOCAINE HCL (CARDIAC) 20 MG/ML IV SOLN
INTRAVENOUS | Status: DC | PRN
  Administered 2013-05-29: 60 mg via INTRAVENOUS

## 2013-05-29 MED ORDER — STERILE WATER FOR IRRIGATION IR SOLN
Status: DC | PRN
  Administered 2013-05-29: 1000 mL

## 2013-05-29 MED ORDER — ATROPINE SULFATE 0.4 MG/ML IJ SOLN
INTRAMUSCULAR | Status: DC | PRN
  Administered 2013-05-29: 0.2 mg via INTRAVENOUS

## 2013-05-29 MED ORDER — STERILE WATER FOR IRRIGATION IR SOLN
Status: DC | PRN
  Administered 2013-05-29: 3000 mL via INTRAVESICAL

## 2013-05-29 MED ORDER — IOHEXOL 350 MG/ML SOLN
INTRAVENOUS | Status: DC | PRN
  Administered 2013-05-29: 7 mL

## 2013-05-29 MED ORDER — GLYCOPYRROLATE 0.2 MG/ML IJ SOLN
INTRAMUSCULAR | Status: DC | PRN
  Administered 2013-05-29: 0.2 mg via INTRAVENOUS

## 2013-05-29 MED ORDER — LACTATED RINGERS IV SOLN
INTRAVENOUS | Status: DC
  Administered 2013-05-29: 100 mL/h via INTRAVENOUS
  Administered 2013-05-29: 09:00:00 via INTRAVENOUS
  Filled 2013-05-29: qty 1000

## 2013-05-29 MED ORDER — FLEET ENEMA 7-19 GM/118ML RE ENEM
1.0000 | ENEMA | Freq: Once | RECTAL | Status: DC
  Filled 2013-05-29: qty 1

## 2013-05-29 MED ORDER — HYDROCODONE-ACETAMINOPHEN 5-325 MG PO TABS: 1.0000 | ORAL_TABLET | ORAL | Status: DC | PRN

## 2013-05-29 MED ORDER — MIDAZOLAM HCL 5 MG/5ML IJ SOLN
INTRAMUSCULAR | Status: DC | PRN
  Administered 2013-05-29: 2 mg via INTRAVENOUS

## 2013-05-29 MED ORDER — CIPROFLOXACIN IN D5W 400 MG/200ML IV SOLN
400.0000 mg | INTRAVENOUS | Status: AC
  Administered 2013-05-29: 400 mg via INTRAVENOUS
  Filled 2013-05-29: qty 200

## 2013-05-29 MED ORDER — ONDANSETRON HCL 4 MG/2ML IJ SOLN
INTRAMUSCULAR | Status: DC | PRN
  Administered 2013-05-29: 4 mg via INTRAVENOUS

## 2013-05-29 SURGICAL SUPPLY — 28 items
BAG URINE DRAINAGE (UROLOGICAL SUPPLIES) ×2 IMPLANT
BLADE SURG ROTATE 9660 (MISCELLANEOUS) ×2 IMPLANT
Brachy Seeds ×148 IMPLANT
CATH FOLEY 2WAY SLVR  5CC 16FR (CATHETERS) ×1
CATH FOLEY 2WAY SLVR 5CC 16FR (CATHETERS) ×1 IMPLANT
CATH ROBINSON RED A/P 20FR (CATHETERS) ×2 IMPLANT
CLOTH BEACON ORANGE TIMEOUT ST (SAFETY) ×2 IMPLANT
COVER MAYO STAND STRL (DRAPES) ×2 IMPLANT
COVER TABLE BACK 60X90 (DRAPES) ×2 IMPLANT
DRSG TEGADERM 4X4.75 (GAUZE/BANDAGES/DRESSINGS) ×2 IMPLANT
DRSG TEGADERM 8X12 (GAUZE/BANDAGES/DRESSINGS) ×2 IMPLANT
GAUZE SPONGE 4X4 12PLY STRL LF (GAUZE/BANDAGES/DRESSINGS) ×2 IMPLANT
GLOVE BIO SURGEON STRL SZ7.5 (GLOVE) ×8 IMPLANT
GLOVE BIO SURGEON STRL SZ8 (GLOVE) ×4 IMPLANT
GLOVE ECLIPSE 8.0 STRL XLNG CF (GLOVE) IMPLANT
GLOVE INDICATOR 7.0 STRL GRN (GLOVE) ×6 IMPLANT
GLOVE SKINSENSE NS SZ6.5 (GLOVE) ×1
GLOVE SKINSENSE STRL SZ6.5 (GLOVE) ×1 IMPLANT
GLOVE SS BIOGEL STRL SZ 7 (GLOVE) ×1 IMPLANT
GLOVE SUPERSENSE BIOGEL SZ 7 (GLOVE) ×1
GOWN PREVENTION PLUS LG XLONG (DISPOSABLE) ×2 IMPLANT
GOWN STRL REIN XL XLG (GOWN DISPOSABLE) ×2 IMPLANT
HOLDER FOLEY CATH W/STRAP (MISCELLANEOUS) ×2 IMPLANT
PACK CYSTOSCOPY (CUSTOM PROCEDURE TRAY) ×2 IMPLANT
SYRINGE 10CC LL (SYRINGE) ×2 IMPLANT
UNDERPAD 30X30 INCONTINENT (UNDERPADS AND DIAPERS) ×4 IMPLANT
WATER STERILE IRR 3000ML UROMA (IV SOLUTION) ×2 IMPLANT
WATER STERILE IRR 500ML POUR (IV SOLUTION) ×2 IMPLANT

## 2013-05-29 NOTE — Anesthesia Postprocedure Evaluation (Signed)
  Anesthesia Post-op Note  Patient: Parker Harrison  Procedure(s) Performed: Procedure(s) (LRB): RADIOACTIVE SEED IMPLANT (N/A)  Patient Location: PACU  Anesthesia Type: General  Level of Consciousness: awake and alert   Airway and Oxygen Therapy: Patient Spontanous Breathing  Post-op Pain: mild  Post-op Assessment: Post-op Vital signs reviewed, Patient's Cardiovascular Status Stable, Respiratory Function Stable, Patent Airway and No signs of Nausea or vomiting  Last Vitals:  Filed Vitals:   05/29/13 1025  BP: 126/49  Pulse: 56  Temp:   Resp: 13    Post-op Vital Signs: stable   Complications: No apparent anesthesia complications

## 2013-05-29 NOTE — Transfer of Care (Signed)
Immediate Anesthesia Transfer of Care Note  Patient: Parker Harrison  Procedure(s) Performed: Procedure(s) (LRB): RADIOACTIVE SEED IMPLANT (N/A)  Patient Location: PACU  Anesthesia Type: General  Level of Consciousness: awake, oriented, sedated and patient cooperative  Airway & Oxygen Therapy: Patient Spontanous Breathing and Patient connected to face mask oxygen  Post-op Assessment: Report given to PACU RN and Post -op Vital signs reviewed and stable  Post vital signs: Reviewed and stable  Complications: No apparent anesthesia complications

## 2013-05-29 NOTE — Progress Notes (Signed)
[facility] Radiation Oncology End of Treatment Note  Name:Parker Harrison  Date: 05/29/2013 ZOX:[id number] DOB:09/12/1941   Status:outpatient    CC: Tally Due, MD  Dr. Marcine Matar  REFERRING PHYSICIAN: Dr. Marcine Matar   DIAGNOSIS: Stage TI C. favorable risk adenocarcinoma prostate   INDICATION FOR TREATMENT: Curative   TREATMENT DATES: Implant date 05/29/2013                         SITE/DOSE:     Prostate 14,500 cGy, isotope I-125 utilizing 74 seeds and 26 active needles. Individual seed activity 0.43 mCi per seed for a total implant activity of 31.7 mCi.                                    NARRATIVE:    The patient appears to undergone a successful Nucletron seed Selectron implant with Dr. Retta Diones                        PLAN: Routine followup in 3 weeks at which time we will obtain a CT scan for his post implant dosimetry.. Patient instructed to call if questions or worsening complaints in interim.

## 2013-05-29 NOTE — Anesthesia Procedure Notes (Signed)
Procedure Name: LMA Insertion Date/Time: 05/29/2013 7:43 AM Performed by: Renella Cunas D Pre-anesthesia Checklist: Patient identified, Emergency Drugs available, Suction available and Patient being monitored Patient Re-evaluated:Patient Re-evaluated prior to inductionOxygen Delivery Method: Circle System Utilized Preoxygenation: Pre-oxygenation with 100% oxygen Intubation Type: IV induction Ventilation: Mask ventilation without difficulty LMA: LMA inserted LMA Size: 4.0 Number of attempts: 1 Airway Equipment and Method: bite block Placement Confirmation: positive ETCO2 Tube secured with: Tape Dental Injury: Teeth and Oropharynx as per pre-operative assessment

## 2013-05-29 NOTE — Op Note (Signed)
Preoperative diagnosis: Clinical stage TI C adenocarcinoma the prostate   Postoperative diagnosis: Same   Procedure: I-125 prostate seed implantation with Nucletron robotic implanter   Surgeon: Bertram Millard. Askari Kinley M.D.  Radiation Oncologist:Robert Dayton Scrape, MD  Anesthesia: Gen.   Indications: Patient  was diagnosed with clinical stage TI C prostate cancer. We had extensive discussion with him about treatment options versus. He elected to proceed with seed implantation. He underwent consultation my office as well as with Dr. Dayton Scrape. He appeared to understand the advantages disadvantages potential risks of this treatment option. Full informed consent has been obtained.   Technique and findings: Patient was brought the operating room where he had successful induction of general anesthesia. He was placed in dorso-lithotomy position and prepped and draped in usual manner. Appropriate surgical timeout was performed. Radiation oncology department placed a transrectal ultrasound probe anchoring stand. Foley catheter with contrast in the balloon was inserted without difficulty. Anchoring needles were placed within the prostate. Rectal tube was placed. Real-time contouring of the urethra prostate and rectum were performed and the dosing parameters were established. Targeted dose was 145 gray.  I was then called  to the operating suite suite for placement of the needles. A second timeout was performed. All needle passage was done with real-time transrectal ultrasound guidance with the sagittal plane. A total of 26 needles were placed. The implantation itself was done with the robotic implanter. 74 active seeds were implanted. A Foley catheter was removed and flexible cystoscopy failed to show any seeds outside the prostate. Bladder appeared normal, prostate was nonobstructed.  The patient was brought to recovery room in stable condition.

## 2013-05-29 NOTE — Progress Notes (Signed)
[facility] Radiation Oncology Brachytherapy Operative Procedure Note  Name: Parker Harrison MRN: [id number]  Date:   03/27/2013           DOB: 1941/10/02  Status:outpatient    WU:[id number], Loretha Stapler, MD  Dr. Marcine Matar DIAGNOSIS: A 72 year old gentlemen with stage T1 C. adenocarcinoma of the prostate with a Gleason of 6 and a PSA of 5.07.  PROCEDURE: Insertion of radioactive I-125 seeds into the prostate gland.  RADIATION DOSE: 145 Gy, definitive therapy.  TECHNIQUE: Parker Harrison was brought to the operating room with Dr. Retta Diones. He was placed in the dorsolithotomy position. He was catheterized and a rectal tube was inserted. The perineum was shaved, prepped and draped. The ultrasound probe was then introduced into the rectum to see the prostate gland.  TREATMENT DEVICE: A needle grid was attached to the ultrasound probe stand and anchor needles were placed.  COMPLEX ISODOSE CALCULATION: The prostate was imaged in 3D using a sagittal sweep of the prostate probe. These images were transferred to the planning computer. There, the prostate, urethra and rectum were defined on each axial reconstructed image. Then, the software created an optimized plan and a few seed positions were adjusted. Then the accepted plan was uploaded to the seed Selectron afterloading unit.  SPECIAL TREATMENT PROCEDURE/SUPERVISION AND HANDLING: The Nucletron FIRST system was used to place the needles under sagittal guidance. A total of 26 needles were used to deposit 74 seeds in the prostate gland. The individual seed activity was 31.7 mCi for a total implant activity of 0.43 mCi.  COMPLEX SIMULATION: At the end of the procedure, an anterior radiograph of the pelvis was obtained to document seed positioning and count. Cystoscopy was performed to check the urethra and bladder.  MICRODOSIMETRY: At the end of the procedure, the patient was emitting 0.06 mrem/hr at 1 meter. Accordingly, he was  considered safe for hospital discharge.  PLAN: The patient will return to the radiation oncology clinic for post implant CT dosimetry in three weeks.

## 2013-06-02 ENCOUNTER — Encounter (HOSPITAL_BASED_OUTPATIENT_CLINIC_OR_DEPARTMENT_OTHER): Payer: Self-pay | Admitting: Urology

## 2013-06-12 ENCOUNTER — Telehealth: Payer: Self-pay | Admitting: *Deleted

## 2013-06-12 NOTE — Telephone Encounter (Signed)
CALLED PATIENT TO REMIND OF APPTS. FOR 06-16-13, SPOKE WITH PATIENT AND HE IS AWARE OF THESE APPTS.

## 2013-06-16 ENCOUNTER — Ambulatory Visit
Admission: RE | Admit: 2013-06-16 | Discharge: 2013-06-16 | Disposition: A | Discharge status: Home / Self Care | Payer: Medicare Other | Source: Ambulatory Visit | Attending: Radiation Oncology | Admitting: Radiation Oncology

## 2013-06-16 ENCOUNTER — Encounter: Payer: Self-pay | Admitting: Radiation Oncology

## 2013-06-16 VITALS — BP 120/71 | HR 56 | Temp 97.9°F | Resp 18 | Wt 183.3 lb

## 2013-06-16 DIAGNOSIS — C61 Malignant neoplasm of prostate: Secondary | ICD-10-CM

## 2013-06-16 NOTE — Progress Notes (Signed)
Complex simulation note: The patient was taken to the CT simulator. His pelvis was scanned. The CT data set was sent to the [facility] planning system for contouring of his rectum and prostate. He'll then undergo 3-D simulation to assess the quality of his implant.

## 2013-06-16 NOTE — Progress Notes (Signed)
Reports on average he gets up once during the night to void. Denies frequency or urgency. Denies burning with urination. Denies hematuria. Denies diarrhea, pain with bowel movements, or blood in stool. Reports a normal strong urine stream.

## 2013-06-16 NOTE — Progress Notes (Signed)
CC: Parker Harrison, Parker Harrison  Followup note: [name] turns today approximately 3 weeks following his prostate seed implant in the management of his stage TI C. favorable risk adenocarcinoma prostate he is doing well from a GU and GI standpoint. He has nocturia x1. He tells that he sees Dr. Retta Diones PA later this week, and Parker Harrison in 3 months.  His CT scan today shows what appears to be an excellent seed distribution throughout the prostate.  Physical examination: Alert and oriented. Filed Vitals:   06/16/13 0933  BP: 120/71  Pulse: 56  Temp: 97.9 F (36.6 C)  Resp: 18   Rectal examination not performed today  Impression: Satisfactory progress with no significant GU or GI difficulties.  Plan: He'll see Dr. Lenoria Chime PA this week and then see Parker Harrison for a followup visit in 3 months. We will move ahead with his post implant dosimetry and forward the results to Parker Harrison. I ask that Parker Harrison keep me posted on his progress.

## 2013-06-17 ENCOUNTER — Encounter: Payer: Self-pay | Admitting: Radiation Oncology

## 2013-06-17 NOTE — Progress Notes (Signed)
CC: Dr. Thayer Ohm Guest by Dr. Marcine Matar   Post implant CT dosimetry/3-D simulation note:  The patient underwent post-implant CT dosimetry/3-D simulation today. Dose volume histograms were obtained for the prostate and rectum. His intraoperative prostate volume by ultrasound was 39.0 cc and postoperative prostate volume by CT 37.2 cc, a good correlation. His prostate D 90 is 118.8% and V100 97%. Only 0.11 cc of rectum received the prescribed dose of 14,500 cGy. In summary, the patient has excellent post implant dosimetry with a low risk for late rectal toxicity.

## 2013-07-02 ENCOUNTER — Ambulatory Visit (INDEPENDENT_AMBULATORY_CARE_PROVIDER_SITE_OTHER): Payer: Medicare Other | Admitting: Family Medicine

## 2013-07-02 VITALS — BP 122/70 | HR 60 | Temp 97.7°F | Resp 16 | Ht 71.0 in | Wt 180.0 lb

## 2013-07-02 DIAGNOSIS — Z4802 Encounter for removal of sutures: Secondary | ICD-10-CM

## 2013-07-02 NOTE — Progress Notes (Signed)
[facility] [address] [place] [address] [phone]  Date:  07/02/2013    Name:  Parker Harrison   DOB:  14-Nov-1941   MRN:  [id number]  PCP:  Tally Due, MD    Chief Complaint: Suture / Staple Removal   History of Present Illness:  Parker Harrison is a 72 y.o. very pleasant male patient who presents with the following:  Here to have staples removed- he actually had appendicitis which occurred when he was in [place] visiting some friends. He had surgery just over a week ago.  Surgery performed at [facility] last Tuesday.  He was released after one week in the hospital.  He was told to have the staples removed in about one week.  Per his report Dr. Audria Nine is aware of his recent operation, but as she is gone this week he came in to the urgent center to have his staples removed.   He is feeling well, eating well.  He is not on abx any more.  He has no complaints and just needs his stables out today  Patient Active Problem List   Diagnosis Date Noted  . Adenocarcinoma of prostate 02/18/2013  . Hypercholesteremia 11/24/2012  . Personal history of colonic polyps 11/22/2012  . Left knee pain 08/05/2012  . Hamstring tightness 11/14/2011  . Quadriceps tendon rupture 08/16/2011  . Knee pain, right 07/20/2011  . Tear of meniscus, knee, medial 07/20/2011    Past Medical History  Diagnosis Date  . Adenomatous polyp of colon   . Hearing aid worn     BILATERAL  . Hypercholesterolemia     NO MEDS  . Prostate cancer     Past Surgical History  Procedure Laterality Date  . Colonoscopy    . Leg wound repair / closure Left 2000    INJURY  . Prostate biopsy    . Tonsillectomy and adenoidectomy  AGE 36  . Radioactive seed implant N/A 05/29/2013    Procedure: RADIOACTIVE SEED IMPLANT;  Surgeon: Marcine Matar, MD;  Location: [facility];  Service: Urology;  Laterality: N/A;    History  Substance Use Topics  . Smoking  status: Never Smoker   . Smokeless tobacco: Never Used  . Alcohol Use: 3.6 oz/week    4 Glasses of wine, 2 Cans of beer per week    Family History  Problem Relation Age of Onset  . Sudden death Neg Hx   . Hypertension Neg Hx   . Hyperlipidemia Neg Hx   . Heart attack Neg Hx   . Diabetes Neg Hx   . Prostate cancer Father   . Prostate cancer Brother   . Dementia Mother     No Known Allergies  Medication list has been reviewed and updated.  Current Outpatient Prescriptions on File Prior to Visit  Medication Sig Dispense Refill  . ciprofloxacin (CIPRO) 250 MG tablet Take 1 tablet (250 mg total) by mouth 2 (two) times daily.      Marland Kitchen HYDROcodone-acetaminophen (NORCO) 5-325 MG per tablet Take 1-2 tablets by mouth every 4 (four) hours as needed for pain.  30 tablet  0  . naproxen sodium (ANAPROX) 220 MG tablet Take 220 mg by mouth as needed.        No current facility-administered medications on file prior to visit.    Review of Systems:  As per HPI- otherwise negative.   Physical Examination: Filed Vitals:   07/02/13 0749  BP:  122/70  Pulse: 60  Temp: 97.7 F (36.5 C)  Resp: 16   Filed Vitals:   07/02/13 0749  Height: 5\' 11"  (1.803 m)  Weight: 180 lb (81.647 kg)   Body mass index is 25.12 kg/(m^2). Ideal Body Weight: Weight in (lb) to have BMI = 25: 178.9  GEN: WDWN, NAD, Non-toxic, A & O x 3, looks well HEENT: Atraumatic, Normocephalic. Neck supple. No masses, No LAD. Ears and Nose: No external deformity. CV: RRR, No M/G/R. No JVD. No thrill. No extra heart sounds. PULM: CTA B, no wheezes, crackles, rhonchi. No retractions. No resp. distress. No accessory muscle use. ABD: S, NT, ND EXTR: No c/c/e NEURO Normal gait.  PSYCH: Normally interactive. Conversant. Not depressed or anxious appearing.  Calm demeanor.  approx 1.5 in incision over McBurney's point on right abdomen.  Well healed, staples in place.   Removed all staples with staple remover. No complication,  pt tolerated well.  The medial edge of wound is not yet completely closed.  Wound prepped with alcohol and then steri- strips applied to support wound  Assessment and Plan: Visit for suture removal  Staples removed today. Doing well after surgery.  See patient instructions for more details.     Signed Abbe Amsterdam, MD

## 2013-07-02 NOTE — Patient Instructions (Addendum)
Let us know if you need anything.  You can shower as usual.  The steri- strips should come off in a few days on their own.   If any sign of infection such as redness, tenderness or pus please let us know.

## 2013-07-11 ENCOUNTER — Telehealth: Payer: Self-pay | Admitting: Oncology

## 2013-07-11 NOTE — Telephone Encounter (Signed)
Received a call from Parker Harrison, Parker Harrison's wife.  She is wondering if it will be OK to have her grandchildren stay with them on 07/29/13.  Parker Harrison had a prostate seed implant on 05/29/13.  Per the Prostate Seed Implants Patient Instructions handout she received, it says that Parker Harrison should limit his time with his grandchildren.  She has also been told that the radiation is about the same as you would receive from being outside in the sun.  She is wondering if they should watch their grandchildren or not.

## 2013-09-15 ENCOUNTER — Other Ambulatory Visit: Payer: Self-pay | Admitting: Dermatology

## 2013-09-16 ENCOUNTER — Telehealth: Payer: Self-pay | Admitting: *Deleted

## 2013-09-16 ENCOUNTER — Encounter: Payer: Self-pay | Admitting: Radiation Oncology

## 2013-09-16 NOTE — Telephone Encounter (Signed)
Called pt who had inquired, through My Chart e mail if Dr Dayton Scrape could recommend an oncologist and urologist in the [place], [place] area. Per Dr Dayton Scrape, he does not know of any physicians in the [place], [place] area. Informed Parker Harrison of Dr Rennie Plowman response.  Advised he contact Dr Dahlstedt's office with the same request. Parker Harrison verbalized understanding and appreciation.

## 2013-09-24 ENCOUNTER — Telehealth: Payer: Self-pay | Admitting: *Deleted

## 2013-09-24 ENCOUNTER — Encounter: Payer: Self-pay | Admitting: Radiation Oncology

## 2013-09-24 NOTE — Telephone Encounter (Signed)
Returned message received from pt earlier today. He was requesting information re: his June 2014  prostate seed implant and exposure to grandchildren. Read pt information from [facility] "Radioactive Iodine-125 Prostate Seed Implants Patient Instructions". Pt stated he wanted more specific instructions related to him being 4 months out from implant. Informed pt would discuss his request w/Dr Dayton Scrape and call him back. Pt stated he is leaving Friday to visit his grandchildren and needs to know before then. Assured pt he would get call back before Friday. Pt's # [phone]. Spoke w/Dr Dayton Scrape who stated he would call pt w/information pt requests.

## 2013-10-16 ENCOUNTER — Other Ambulatory Visit: Payer: Self-pay

## 2013-11-25 ENCOUNTER — Encounter: Payer: Self-pay | Admitting: Family Medicine

## 2013-11-25 ENCOUNTER — Ambulatory Visit (INDEPENDENT_AMBULATORY_CARE_PROVIDER_SITE_OTHER): Payer: Medicare Other | Admitting: Family Medicine

## 2013-11-25 VITALS — BP 120/78 | HR 59 | Temp 97.7°F | Resp 16 | Ht 70.75 in | Wt 177.6 lb

## 2013-11-25 DIAGNOSIS — Z0000 Encounter for general adult medical examination without abnormal findings: Secondary | ICD-10-CM

## 2013-11-25 DIAGNOSIS — C61 Malignant neoplasm of prostate: Secondary | ICD-10-CM

## 2013-11-25 DIAGNOSIS — E7800 Pure hypercholesterolemia, unspecified: Secondary | ICD-10-CM

## 2013-11-25 LAB — COMPLETE METABOLIC PANEL WITH GFR
ALT: 25 U/L (ref 0–53)
AST: 26 U/L (ref 0–37)
Albumin: 4.8 g/dL (ref 3.5–5.2)
CO2: 30 mEq/L (ref 19–32)
Calcium: 10.1 mg/dL (ref 8.4–10.5)
Chloride: 100 mEq/L (ref 96–112)
Creat: 1.06 mg/dL (ref 0.50–1.35)
GFR, Est African American: 81 mL/min
Potassium: 4.2 mEq/L (ref 3.5–5.3)
Sodium: 137 mEq/L (ref 135–145)
Total Protein: 7.6 g/dL (ref 6.0–8.3)

## 2013-11-25 LAB — LIPID PANEL
LDL Cholesterol: 169 mg/dL — ABNORMAL HIGH (ref 0–99)
VLDL: 27 mg/dL (ref 0–40)

## 2013-11-25 NOTE — Progress Notes (Addendum)
Subjective:    Patient ID: Parker Harrison, male    DOB: 1941/04/30, 72 y.o.   MRN: [id number]  HPI  This 72 y.o. Cauc male is here for Subsequent MCR CPE. He being treated at [facility] for adenocarcinoma of the prostate. Pt takes no prescription medications; his exercise regimen includes yoga twice a week.  HCM: CRS- Current.            IMM- Pt declines Flu vaccine today; otherwise current.            Vision- Pt to schedule appt.  Patient Active Problem List   Diagnosis Date Noted  . Adenocarcinoma of prostate 02/18/2013  . Hypercholesteremia 11/24/2012  . Personal history of colonic polyps 11/22/2012  . Left knee pain 08/05/2012  . Hamstring tightness 11/14/2011  . Quadriceps tendon rupture 08/16/2011  . Knee pain, right 07/20/2011  . Tear of meniscus, knee, medial 07/20/2011   PMHx, Surg Hx, Soc and Fam Hx reviewed.    Review of Systems  Constitutional: Negative.   HENT: Positive for hearing loss and nosebleeds.   Eyes: Negative.   Respiratory: Negative.   Cardiovascular: Negative.   Gastrointestinal: Negative.   Endocrine: Negative.   Genitourinary: Negative.   Musculoskeletal: Negative.   Skin: Negative.   Allergic/Immunologic: Negative.   Neurological: Negative.   Hematological: Negative.   Psychiatric/Behavioral: Negative.       Objective:   Physical Exam  Nursing note and vitals reviewed. Constitutional: He is oriented to person, place, and time. Vital signs are normal. He appears well-developed and well-nourished. No distress.  HENT:  Head: Normocephalic and atraumatic.  Right Ear: Tympanic membrane, external ear and ear canal normal. Decreased hearing is noted.  Left Ear: Tympanic membrane, external ear and ear canal normal. Decreased hearing is noted.  Nose: Nose normal. No nasal deformity or septal deviation.  Mouth/Throat: Uvula is midline and oropharynx is clear and moist. No oral lesions. Normal dentition.  Pt wears bilateral hearing aids.    Eyes: Conjunctivae, EOM and lids are normal. Pupils are equal, round, and reactive to light.  Neck: Neck supple. No JVD present. No spinous process tenderness and no muscular tenderness present. Decreased range of motion present. No mass and no thyromegaly present.  Decreased ROM w/ rotation; flexion and extension normal.  Cardiovascular: Normal rate, regular rhythm, S1 normal, S2 normal and normal heart sounds.   No extrasystoles are present. PMI is not displaced.  Exam reveals no gallop and no friction rub.   No murmur heard. Pulses:      Radial pulses are 2+ on the right side, and 2+ on the left side.       Femoral pulses are 2+ on the right side, and 2+ on the left side.      Popliteal pulses are 1+ on the right side, and 1+ on the left side.       Dorsalis pedis pulses are 1+ on the right side, and 1+ on the left side.       Posterior tibial pulses are 1+ on the right side, and 1+ on the left side.  Tips of lower ext digits cool to touch  Pulmonary/Chest: Effort normal and breath sounds normal. No respiratory distress.  Abdominal: Soft. Normal appearance and normal aorta. He exhibits no distension, no abdominal bruit, no pulsatile midline mass and no mass. There is no hepatosplenomegaly. There is no tenderness. There is no guarding and no CVA tenderness.  Genitourinary:  Exam deferred.  Musculoskeletal:  Right shoulder: Normal.       Left shoulder: Normal.       Right elbow: He exhibits deformity. He exhibits normal range of motion. No tenderness found.       Left elbow: Normal.       Right wrist: Normal.       Left wrist: Normal.       Right knee: Normal.       Left knee: Normal.       Cervical back: Normal.       Thoracic back: Normal.       Lumbar back: Normal.       Right hand: Normal.       Left hand: Normal.       Right upper leg: Normal.       Left upper leg: Normal.       Right foot: Normal.       Left foot: Normal.  Mild degenerative changes in all joints.   Lymphadenopathy:       Head (right side): No submental, no submandibular, no tonsillar, no posterior auricular and no occipital adenopathy present.       Head (left side): No submental, no submandibular, no tonsillar, no posterior auricular and no occipital adenopathy present.    He has no cervical adenopathy.    He has no axillary adenopathy.       Right: No supraclavicular and no epitrochlear adenopathy present.       Left: No supraclavicular and no epitrochlear adenopathy present.  Neurological: He is alert and oriented to person, place, and time. He has normal strength and normal reflexes. He displays no atrophy and no tremor. No cranial nerve deficit or sensory deficit. He exhibits normal muscle tone. He displays a negative Romberg sign. Coordination normal.  Skin: Skin is warm, dry and intact. No ecchymosis and no rash noted. He is not diaphoretic. No cyanosis or erythema. No pallor. Nails show no clubbing.  Psychiatric: He has a normal mood and affect. His speech is normal and behavior is normal. Judgment and thought content normal. Cognition and memory are normal.       Assessment & Plan:  Routine general medical examination at a health care facility - Plan: COMPLETE METABOLIC PANEL WITH GFR, Lipid panel, PSA, total and free  Pure hypercholesterolemia - Advised OTC Fish Oil capsules; pt prefers to try Omega-3 [place] Plan: Lipid panel  Prostate cancer - Management per Dr. Retta Diones at [facility]  Plan: PSA, total and free

## 2013-11-25 NOTE — Patient Instructions (Addendum)
Keeping you healthy  Get these tests  Blood pressure- Have your blood pressure checked once a year by your healthcare provider.  Normal blood pressure is 120/80  Weight- Have your body mass index (BMI) calculated to screen for obesity.  BMI is a measure of body fat based on height and weight. You can also calculate your own BMI at [email or web address].  Cholesterol- Have your cholesterol checked every year.  Diabetes- Have your blood sugar checked regularly if you have high blood pressure, high cholesterol, have a family history of diabetes or if you are overweight.  Screening for Colon Cancer- Colonoscopy starting at age 30.  Screening may begin sooner depending on your family history and other health conditions. Follow up colonoscopy as directed by your Gastroenterologist.  Screening for Prostate Cancer- Both blood work (PSA) and a rectal exam help screen for Prostate Cancer.  Screening begins at age 40 with African-American men and at age 25 with Caucasian men.  Screening may begin sooner depending on your family history.  Take these medicines  Aspirin- One aspirin daily can help prevent Heart disease and Stroke.  Flu shot- Every fall. You have declined this vaccine this winter.  Tetanus- Every 10 years. You received Tdap in 2007.   Zostavax- Once after the age of 45 to prevent Shingles. You had this vaccine in 2012.  Pneumonia shot- Once after the age of 68; if you are younger than 54, ask your healthcare provider if you need a Pneumonia shot. You had this vaccine in 2009.  Take these steps  Don't smoke- If you do smoke, talk to your doctor about quitting.  For tips on how to quit, go to www.smokefree.gov or call 1-800-QUIT-NOW.  Be physically active- Exercise 5 days a week for at least 30 minutes.  If you are not already physically active start slow and gradually work up to 30 minutes of moderate physical activity.  Examples of moderate activity include walking briskly,  mowing the yard, dancing, swimming, bicycling, etc.  Eat a healthy diet- Eat a variety of healthy food such as fruits, vegetables, low fat milk, low fat cheese, yogurt, lean meant, poultry, fish, beans, tofu, etc. For more information go to www.thenutritionsource.org  Drink alcohol in moderation- Limit alcohol intake to less than two drinks a day. Never drink and drive.  Dentist- Brush and floss twice daily; visit your dentist twice a year.  Depression- Your emotional health is as important as your physical health. If you're feeling down, or losing interest in things you would normally enjoy please talk to your healthcare provider.  Eye exam- Visit your eye doctor every year.  Safe sex- If you may be exposed to a sexually transmitted infection, use a condom.  Seat belts- Seat belts can save your life; always wear one.  Smoke/Carbon Monoxide detectors- These detectors need to be installed on the appropriate level of your home.  Replace batteries at least once a year.  Skin cancer- When out in the sun, cover up and use sunscreen 15 SPF or higher.  Violence- If anyone is threatening you, please tell your healthcare provider.  Living Will/ Health care power of attorney- Speak with your healthcare provider and family.    You have elevated total and LDL cholesterol last year.  Over the counter Fish Oil supplement helps improve lipid profile. Get 1000 mg capsules and try taking 1-2 capsules daily.

## 2013-11-26 ENCOUNTER — Encounter: Payer: Self-pay | Admitting: Family Medicine

## 2013-11-26 LAB — PSA, TOTAL AND FREE: PSA: 1.05 ng/mL (ref <=4.00)

## 2014-01-20 ENCOUNTER — Encounter: Payer: Self-pay | Admitting: Family Medicine

## 2014-08-12 ENCOUNTER — Encounter: Payer: Self-pay | Admitting: *Deleted

## 2014-08-20 ENCOUNTER — Encounter: Payer: Self-pay | Admitting: *Deleted

## 2014-10-12 ENCOUNTER — Other Ambulatory Visit: Payer: Self-pay | Admitting: Dermatology

## 2014-10-27 ENCOUNTER — Telehealth: Payer: Self-pay | Admitting: Radiology

## 2014-10-27 ENCOUNTER — Other Ambulatory Visit (INDEPENDENT_AMBULATORY_CARE_PROVIDER_SITE_OTHER): Payer: Medicare HMO | Admitting: Radiology

## 2014-10-27 DIAGNOSIS — C61 Malignant neoplasm of prostate: Secondary | ICD-10-CM

## 2014-10-27 DIAGNOSIS — Z1389 Encounter for screening for other disorder: Secondary | ICD-10-CM

## 2014-10-27 DIAGNOSIS — E785 Hyperlipidemia, unspecified: Secondary | ICD-10-CM

## 2014-10-27 DIAGNOSIS — Z1329 Encounter for screening for other suspected endocrine disorder: Secondary | ICD-10-CM

## 2014-10-27 LAB — COMPLETE METABOLIC PANEL WITH GFR
ALT: 32 U/L (ref 0–53)
AST: 33 U/L (ref 0–37)
Albumin: 4.5 g/dL (ref 3.5–5.2)
Alkaline Phosphatase: 89 U/L (ref 39–117)
BUN: 18 mg/dL (ref 6–23)
CALCIUM: 9.5 mg/dL (ref 8.4–10.5)
CHLORIDE: 98 meq/L (ref 96–112)
CO2: 29 mEq/L (ref 19–32)
CREATININE: 1.04 mg/dL (ref 0.50–1.35)
GFR, Est African American: 83 mL/min
GFR, Est Non African American: 71 mL/min
Glucose, Bld: 82 mg/dL (ref 70–99)
Potassium: 4.2 mEq/L (ref 3.5–5.3)
Sodium: 135 mEq/L (ref 135–145)
Total Bilirubin: 0.6 mg/dL (ref 0.2–1.2)
Total Protein: 7.3 g/dL (ref 6.0–8.3)

## 2014-10-27 LAB — CBC WITH DIFFERENTIAL/PLATELET
BASOS ABS: 0.1 10*3/uL (ref 0.0–0.1)
Basophils Relative: 2 % — ABNORMAL HIGH (ref 0–1)
Eosinophils Absolute: 0.2 10*3/uL (ref 0.0–0.7)
Eosinophils Relative: 4 % (ref 0–5)
HCT: 42.5 % (ref 39.0–52.0)
Hemoglobin: 14 g/dL (ref 13.0–17.0)
LYMPHS PCT: 24 % (ref 12–46)
Lymphs Abs: 1 10*3/uL (ref 0.7–4.0)
MCH: 29.5 pg (ref 26.0–34.0)
MCHC: 32.9 g/dL (ref 30.0–36.0)
MCV: 89.7 fL (ref 78.0–100.0)
MONO ABS: 0.4 10*3/uL (ref 0.1–1.0)
MPV: 9.6 fL (ref 9.4–12.4)
Monocytes Relative: 10 % (ref 3–12)
NEUTROS ABS: 2.5 10*3/uL (ref 1.7–7.7)
Neutrophils Relative %: 60 % (ref 43–77)
PLATELETS: 221 10*3/uL (ref 150–400)
RBC: 4.74 MIL/uL (ref 4.22–5.81)
RDW: 14.3 % (ref 11.5–15.5)
WBC: 4.1 10*3/uL (ref 4.0–10.5)

## 2014-10-27 LAB — LIPID PANEL
CHOL/HDL RATIO: 4.1 ratio
CHOLESTEROL: 243 mg/dL — AB (ref 0–200)
HDL: 59 mg/dL (ref >39)
LDL Cholesterol: 160 mg/dL — ABNORMAL HIGH (ref 0–99)
TRIGLYCERIDES: 119 mg/dL (ref <150)
VLDL: 24 mg/dL (ref 0–40)

## 2014-10-28 LAB — PSA, MEDICARE: PSA: 0.39 ng/mL (ref <=4.00)

## 2014-10-28 LAB — TSH: TSH: 2.04 u[IU]/mL (ref 0.350–4.500)

## 2014-10-28 NOTE — Telephone Encounter (Signed)
My chart note sent.

## 2014-11-24 ENCOUNTER — Telehealth: Payer: Self-pay | Admitting: Family Medicine

## 2014-11-24 NOTE — Telephone Encounter (Signed)
Left a message with wife for patient to return call

## 2014-12-08 ENCOUNTER — Ambulatory Visit (INDEPENDENT_AMBULATORY_CARE_PROVIDER_SITE_OTHER): Payer: Medicare HMO | Admitting: Family Medicine

## 2014-12-08 ENCOUNTER — Encounter: Payer: Self-pay | Admitting: Family Medicine

## 2014-12-08 VITALS — BP 126/70 | HR 56 | Temp 98.0°F | Resp 16 | Ht 71.0 in | Wt 182.0 lb

## 2014-12-08 DIAGNOSIS — Z0000 Encounter for general adult medical examination without abnormal findings: Secondary | ICD-10-CM

## 2014-12-08 DIAGNOSIS — E7800 Pure hypercholesterolemia, unspecified: Secondary | ICD-10-CM

## 2014-12-08 DIAGNOSIS — Z1210 Encounter for screening for malignant neoplasm of intestinal tract, unspecified: Secondary | ICD-10-CM

## 2014-12-08 DIAGNOSIS — Z1211 Encounter for screening for malignant neoplasm of colon: Secondary | ICD-10-CM

## 2014-12-08 NOTE — Progress Notes (Signed)
Subjective:    Patient ID: Parker Harrison, male    DOB: 1941/09/19, 73 y.o.   MRN: [id number]  HPI  This very healthy 73 y.o. Cauc male is here for annual [facility] Subsequent exam. He stays active w/ tennis, walking and other activities. Pt sees urologist regularly for prostate cancer surveillance and reports last PSA was in normal range. Pt has hypercholesterolemia; declines prescription medication but follows nutritional guidelines for lipid reduction.   Patient Active Problem List   Diagnosis Date Noted  . Adenocarcinoma of prostate 02/18/2013  . Hypercholesteremia 11/24/2012  . Personal history of colonic polyps 11/22/2012  . Left knee pain 08/05/2012  . Hamstring tightness 11/14/2011  . Quadriceps tendon rupture 08/16/2011  . Knee pain, right 07/20/2011  . Tear of meniscus, knee, medial 07/20/2011    Prior to Admission medications   Medication Sig Start Date End Date Taking? Authorizing Provider  naproxen sodium (ANAPROX) 220 MG tablet Take 220 mg by mouth as needed.    Yes Historical Provider, MD    History   Social History  . Marital Status: Married    Spouse Name: N/A    Number of Children: 2  . Years of Education: N/A   Occupational History  . retired    Social History Main Topics  . Smoking status: Never Smoker   . Smokeless tobacco: Never Used  . Alcohol Use: 3.6 oz/week    4 Glasses of wine, 2 Cans of beer per week     Comment: 5-6 drinks  . Drug Use: No  . Sexual Activity: Not on file   Other Topics Concern  . Not on file   Social History Narrative   Married. Education: [employer].   Retired    Family History  Problem Relation Age of Onset  . Sudden death Neg Hx   . Hypertension Neg Hx   . Hyperlipidemia Neg Hx   . Heart attack Neg Hx   . Diabetes Neg Hx   . Prostate cancer Father   . Prostate cancer Brother   . Dementia Mother   . Cancer Brother    Both parents lived beyond 80th year of life; mother lived to age 27.   Review of Systems    Constitutional: Negative.   HENT: Negative.   Eyes: Negative.   Respiratory: Negative for cough, chest tightness and shortness of breath.   Cardiovascular: Negative for chest pain, palpitations and leg swelling.  Gastrointestinal: Negative.   Endocrine: Negative.   Genitourinary: Negative.   Musculoskeletal: Positive for arthralgias. Negative for myalgias, back pain and gait problem.  Skin:       Skin lesions -checked annually by DERM.  Allergic/Immunologic: Negative.   Neurological: Negative.   Hematological: Negative.   Psychiatric/Behavioral: Negative.       Objective:   Physical Exam  Constitutional: He is oriented to person, place, and time. Vital signs are normal. He appears well-developed and well-nourished. No distress.  HENT:  Head: Normocephalic and atraumatic.  Right Ear: Decreased hearing is noted.  Left Ear: Decreased hearing is noted.  Nose: No nasal deformity or septal deviation.  Mouth/Throat: Uvula is midline, oropharynx is clear and moist and mucous membranes are normal. No oral lesions. Normal dentition. No dental caries.  Wears hearing aids.  Eyes: Conjunctivae, EOM and lids are normal. Pupils are equal, round, and reactive to light. No scleral icterus.  Neck: Trachea normal, full passive range of motion without pain and phonation normal. Neck supple. No JVD present. No spinous process  tenderness and no muscular tenderness present. Carotid bruit is not present. Decreased range of motion present. No thyroid mass and no thyromegaly present.  Rotation restricted < 180 degrees.  Cardiovascular: Normal rate, regular rhythm, S1 normal, S2 normal, normal heart sounds, intact distal pulses and normal pulses.   No extrasystoles are present. PMI is not displaced.  Exam reveals no gallop and no friction rub.   No murmur heard. Pulmonary/Chest: Effort normal and breath sounds normal. No respiratory distress.  Abdominal: Soft. Normal appearance and normal aorta. He exhibits  no distension, no abdominal bruit, no pulsatile midline mass and no mass. There is no hepatosplenomegaly. There is no tenderness. There is no guarding and no CVA tenderness.  Musculoskeletal:       Cervical back: Normal.       Thoracic back: Normal.       Lumbar back: Normal.  Major joints w/ DJD changes; good ROM w/o effusion, redness, warmth or tenderness.  Lymphadenopathy:       Head (right side): No submental, no submandibular, no tonsillar, no preauricular, no posterior auricular and no occipital adenopathy present.       Head (left side): No submental, no submandibular, no tonsillar, no preauricular, no posterior auricular and no occipital adenopathy present.    He has no cervical adenopathy.       Right: No inguinal and no supraclavicular adenopathy present.       Left: No inguinal and no supraclavicular adenopathy present.  Neurological: He is alert and oriented to person, place, and time. He has normal strength and normal reflexes. He displays no atrophy and no tremor. No cranial nerve deficit or sensory deficit. He exhibits normal muscle tone. He displays a negative Romberg sign. Coordination and gait normal.  Get Up and Go test: time = 8 seconds.  Skin: Skin is warm, dry and intact. Lesion noted. No ecchymosis, no petechiae and no rash noted. He is not diaphoretic. No cyanosis or erythema. No pallor. Nails show no clubbing.  Multiple Seb Ks on back/trunk.  Psychiatric: He has a normal mood and affect. His speech is normal and behavior is normal. Judgment and thought content normal. Cognition and memory are normal.  Nursing note and vitals reviewed.      Assessment & Plan:  Medicare annual wellness visit, subsequent  Hypercholesteremia - Pt again declines medication. Plan: Lipid panel (future order placed for 5-6 months from last lipid panel). Pt will continue to observe lifestyle modifications.

## 2014-12-08 NOTE — Patient Instructions (Signed)
Keeping you healthy  Get these tests  Blood pressure- Have your blood pressure checked once a year by your healthcare provider.  Normal blood pressure is 120/80  Weight- Have your body mass index (BMI) calculated to screen for obesity.  BMI is a measure of body fat based on height and weight. You can also calculate your own BMI at ViewBanking.si.  Cholesterol- Have your cholesterol checked every year.  Diabetes- Have your blood sugar checked regularly if you have high blood pressure, high cholesterol, have a family history of diabetes or if you are overweight.  Screening for Colon Cancer- Colonoscopy starting at age 6.  Screening may begin sooner depending on your family history and other health conditions. Follow up colonoscopy as directed by your Gastroenterologist.  Screening for Prostate Cancer- Both blood work (PSA) and a rectal exam help screen for Prostate Cancer.  Screening begins at age 84 with African-American men and at age 62 with Caucasian men.  Screening may begin sooner depending on your family history.  Take these medicines  Aspirin- One aspirin daily can help prevent Heart disease and Stroke.  Flu shot- Every fall.  Tetanus- Every 10 years.  Zostavax- Once after the age of 31 to prevent Shingles.  Pneumonia shot- Once after the age of 51; if you are younger than 52, ask your healthcare provider if you need a Pneumonia shot.  Take these steps  Don't smoke- If you do smoke, talk to your doctor about quitting.  For tips on how to quit, go to www.smokefree.gov or call 1-800-QUIT-NOW.  Be physically active- Exercise 5 days a week for at least 30 minutes.  If you are not already physically active start slow and gradually work up to 30 minutes of moderate physical activity.  Examples of moderate activity include walking briskly, mowing the yard, dancing, swimming, bicycling, etc.  Eat a healthy diet- Eat a variety of healthy food such as fruits, vegetables, low  fat milk, low fat cheese, yogurt, lean meant, poultry, fish, beans, tofu, etc. For more information go to www.thenutritionsource.org  Drink alcohol in moderation- Limit alcohol intake to less than two drinks a day. Never drink and drive.  Dentist- Brush and floss twice daily; visit your dentist twice a year.  Depression- Your emotional health is as important as your physical health. If you're feeling down, or losing interest in things you would normally enjoy please talk to your healthcare provider.  Eye exam- Visit your eye doctor every year.  Safe sex- If you may be exposed to a sexually transmitted infection, use a condom.  Seat belts- Seat belts can save your life; always wear one.  Smoke/Carbon Monoxide detectors- These detectors need to be installed on the appropriate level of your home.  Replace batteries at least once a year.  Skin cancer- When out in the sun, cover up and use sunscreen 15 SPF or higher.  Violence- If anyone is threatening you, please tell your healthcare provider.  Living Will/ Health care power of attorney- Speak with your healthcare provider and family.    YOU ARE DOING EXTREMELY WELL; I AM PROUD OF YOUR HARD WORK!!!  KEEP IT UP; IT MAKES MY JOB EASIER. [place]!!!  SEE YOU NEXT YEAR BUT REMEMBER TO RETURN IN MID TO LATE June 2016 FOR FASTING LIPID PANEL.

## 2014-12-31 LAB — IFOBT (OCCULT BLOOD): IFOBT: NEGATIVE

## 2014-12-31 NOTE — Addendum Note (Signed)
Addended by: Yvette Rack on: 12/31/2014 02:53 PM   Modules accepted: Orders

## 2015-03-23 ENCOUNTER — Telehealth: Payer: Self-pay | Admitting: Family Medicine

## 2015-03-23 NOTE — Telephone Encounter (Signed)
Patient dropped off form for Dr. Leward Quan to complete. Patient stated the provider need to follow the instructions on the form. Patient request for the form to be faxed. The form is for the patient to have a hearing test. Please call the patient if you have any questions. [phone]. I will place the form on Dr. Leward Quan desk for completion.

## 2015-03-25 NOTE — Telephone Encounter (Signed)
Did you complete this already?

## 2015-03-25 NOTE — Telephone Encounter (Signed)
Yes; I followed the instructions as outlined and the form is attached to a prescription. It is at [facility] work station to be faxed.

## 2015-03-29 NOTE — Telephone Encounter (Signed)
Form faxed to [place].

## 2015-04-12 ENCOUNTER — Other Ambulatory Visit: Payer: Self-pay | Admitting: Dermatology

## 2015-05-25 ENCOUNTER — Encounter: Payer: Self-pay | Admitting: Family Medicine

## 2015-05-25 DIAGNOSIS — H9190 Unspecified hearing loss, unspecified ear: Secondary | ICD-10-CM | POA: Insufficient documentation

## 2015-11-29 ENCOUNTER — Encounter: Payer: Medicare HMO | Admitting: Family Medicine

## 2015-12-10 ENCOUNTER — Encounter: Payer: Medicare HMO | Admitting: Family Medicine

## 2015-12-13 ENCOUNTER — Encounter: Payer: Medicare HMO | Admitting: Family Medicine

## 2015-12-15 ENCOUNTER — Encounter: Payer: Self-pay | Admitting: Family Medicine

## 2015-12-15 ENCOUNTER — Ambulatory Visit (INDEPENDENT_AMBULATORY_CARE_PROVIDER_SITE_OTHER): Payer: PPO | Admitting: Family Medicine

## 2015-12-15 VITALS — BP 138/74 | HR 70 | Temp 97.4°F | Resp 16 | Ht 71.25 in | Wt 182.2 lb

## 2015-12-15 DIAGNOSIS — Z23 Encounter for immunization: Secondary | ICD-10-CM | POA: Diagnosis not present

## 2015-12-15 DIAGNOSIS — Z0000 Encounter for general adult medical examination without abnormal findings: Secondary | ICD-10-CM

## 2015-12-15 DIAGNOSIS — Z130 Encounter for screening for diseases of the blood and blood-forming organs and certain disorders involving the immune mechanism: Secondary | ICD-10-CM | POA: Diagnosis not present

## 2015-12-15 DIAGNOSIS — Z5181 Encounter for therapeutic drug level monitoring: Secondary | ICD-10-CM

## 2015-12-15 DIAGNOSIS — Z13220 Encounter for screening for lipoid disorders: Secondary | ICD-10-CM

## 2015-12-15 DIAGNOSIS — Z131 Encounter for screening for diabetes mellitus: Secondary | ICD-10-CM | POA: Diagnosis not present

## 2015-12-15 DIAGNOSIS — E785 Hyperlipidemia, unspecified: Secondary | ICD-10-CM | POA: Diagnosis not present

## 2015-12-15 LAB — CBC
HEMATOCRIT: 42.6 % (ref 39.0–52.0)
Hemoglobin: 14.1 g/dL (ref 13.0–17.0)
MCH: 29.6 pg (ref 26.0–34.0)
MCHC: 33.1 g/dL (ref 30.0–36.0)
MCV: 89.3 fL (ref 78.0–100.0)
MPV: 9.4 fL (ref 8.6–12.4)
Platelets: 207 10*3/uL (ref 150–400)
RBC: 4.77 MIL/uL (ref 4.22–5.81)
RDW: 13.9 % (ref 11.5–15.5)
WBC: 3.9 10*3/uL — ABNORMAL LOW (ref 4.0–10.5)

## 2015-12-15 LAB — LIPID PANEL
CHOL/HDL RATIO: 4.1 ratio (ref <=5.0)
CHOLESTEROL: 247 mg/dL — AB (ref 125–200)
HDL: 60 mg/dL (ref >=40)
LDL Cholesterol: 169 mg/dL — ABNORMAL HIGH (ref <130)
Triglycerides: 90 mg/dL (ref <150)
VLDL: 18 mg/dL (ref <30)

## 2015-12-15 LAB — COMPREHENSIVE METABOLIC PANEL
ALBUMIN: 4.5 g/dL (ref 3.6–5.1)
ALK PHOS: 84 U/L (ref 40–115)
ALT: 34 U/L (ref 9–46)
AST: 31 U/L (ref 10–35)
BUN: 18 mg/dL (ref 7–25)
CALCIUM: 9.8 mg/dL (ref 8.6–10.3)
CO2: 27 mmol/L (ref 20–31)
Chloride: 100 mmol/L (ref 98–110)
Creat: 1.09 mg/dL (ref 0.70–1.18)
Glucose, Bld: 86 mg/dL (ref 65–99)
POTASSIUM: 4.5 mmol/L (ref 3.5–5.3)
Sodium: 137 mmol/L (ref 135–146)
TOTAL PROTEIN: 7.1 g/dL (ref 6.1–8.1)
Total Bilirubin: 1 mg/dL (ref 0.2–1.2)

## 2015-12-15 NOTE — Patient Instructions (Signed)
It was great to see you today- have a wonderful year.  I will be in touch with your labs We can plan for a physical in one year: I am glad to see you at my new office or you can certainly see one of my other partners here at [facility] if you prefer  Your tetanus shot will be due at the end of this year- if you have any significant wound please come in to get a tetanus booster

## 2015-12-15 NOTE — Progress Notes (Addendum)
[facility] [address], [place] [address] [phone]  Date:  12/15/2015   Name:  Parker Harrison   DOB:  1941-10-11   MRN:  [id number]  PCP:  Kennon Portela, MD    Chief Complaint: Annual Exam   History of Present Illness:  Parker Harrison is a 75 y.o. very pleasant male patient who presents with the following:  Here today for a CPE History of prostate cancer- followed by Dr. Diona Fanti, last visit over the summer of 2016.  He is on q 6 month follow-up.   Flu shot: declines this year.  Discussed with pt myself and he declines Colonoscpy is UTD Tetanus shot is due later this year. No recent wound Pneumonia vaccine: he had a pneumonia vaccine (must have been pneumovax) back in 2009.  Would like to do prevnar today  Last labs done a year ago He is a retired  He enjoys tennis and the gym for exercise.    He is fasting today.   His brothers have both been dx with cancer recently One with tonsil cancer, the other with lung cancer.  Neither smoked or used other tobacco.  Issaih is a bit worried about his and wonders if he needs any special screening.  Advised that most cancers of this type are sporadic or due to carcinogens and not likely to convey much in the way of genetic risk to himself  Leandros has never smoked or used smokeless tobacco, he does not drink heavily   Patient Active Problem List   Diagnosis Date Noted  . Hearing loss 05/25/2015  . Adenocarcinoma of prostate ([place]) 02/18/2013  . Hypercholesteremia 11/24/2012  . Personal history of colonic polyps 11/22/2012  . Left knee pain 08/05/2012  . Hamstring tightness 11/14/2011  . Quadriceps tendon rupture 08/16/2011  . Knee pain, right 07/20/2011  . Tear of meniscus, knee, medial 07/20/2011    Past Medical History  Diagnosis Date  . Adenomatous polyp of colon   . Hearing aid worn     BILATERAL  . Hypercholesterolemia     NO MEDS  . Prostate cancer [facility])     Past Surgical History   Procedure Laterality Date  . Colonoscopy    . Leg wound repair / closure Left 2000    INJURY  . Prostate biopsy    . Tonsillectomy and adenoidectomy  AGE 33  . Radioactive seed implant N/A 05/29/2013    Procedure: RADIOACTIVE SEED IMPLANT;  Surgeon: Franchot Gallo, MD;  Location: [facility];  Service: Urology;  Laterality: N/A;  . Appendectomy    . Vasectomy    . Prostate surgery      Social History  Substance Use Topics  . Smoking status: Never Smoker   . Smokeless tobacco: Never Used  . Alcohol Use: 3.6 oz/week    4 Glasses of wine, 2 Cans of beer per week     Comment: 5-6 drinks    Family History  Problem Relation Age of Onset  . Sudden death Neg Hx   . Hypertension Neg Hx   . Hyperlipidemia Neg Hx   . Heart attack Neg Hx   . Diabetes Neg Hx   . Prostate cancer Father   . Prostate cancer Brother   . Dementia Mother   . Cancer Brother     No Known Allergies  Medication list has been reviewed and updated.  Current Outpatient Prescriptions on File Prior to Visit  Medication Sig Dispense Refill  .  naproxen sodium (ANAPROX) 220 MG tablet Take 220 mg by mouth as needed.      No current facility-administered medications on file prior to visit.    Review of Systems:  As per HPI- otherwise negative.   Physical Examination: Filed Vitals:   12/15/15 0836  BP: 138/74  Pulse: 70  Temp: 97.4 F (36.3 C)  Resp: 16   Filed Vitals:   12/15/15 0836  Height: 5' 11.25" (1.81 m)  Weight: 182 lb 3.2 oz (82.645 kg)   Body mass index is 25.23 kg/(m^2). Ideal Body Weight: Weight in (lb) to have BMI = 25: 180.1  GEN: WDWN, NAD, Non-toxic, A & O x 3, looks well and in excellent health for age 74: Atraumatic, Normocephalic. Neck supple. No masses, No LAD.  Bilateral TM wnl, oropharynx normal.  PEERL,EOMI. Ears and Nose: No external deformity. CV: RRR, No M/G/R. No JVD. No thrill. No extra heart sounds. PULM: CTA B, no wheezes, crackles, rhonchi. No  retractions. No resp. distress. No accessory muscle use. ABD: S, NT, ND. No rebound. No HSM. EXTR: No c/c/e.  Normal pedal pulses  NEURO Normal gait.  PSYCH: Normally interactive. Conversant. Not depressed or anxious appearing.  Calm demeanor.    Assessment and Plan: Physical exam  Immunization due - Plan: Pneumococcal conjugate vaccine 13-valent IM  Screening for hyperlipidemia - Plan: Lipid panel  Screening for diabetes mellitus - Plan: Comprehensive metabolic panel  Screening for deficiency anemia - Plan: CBC  Medication monitoring encounter - Plan: CBC  75 yo man in excellent health Await labs and will be in touch with him  Signed Lamar Blinks, MD

## 2016-04-24 DIAGNOSIS — L570 Actinic keratosis: Secondary | ICD-10-CM | POA: Diagnosis not present

## 2016-04-24 DIAGNOSIS — Z85828 Personal history of other malignant neoplasm of skin: Secondary | ICD-10-CM | POA: Diagnosis not present

## 2016-04-24 DIAGNOSIS — L821 Other seborrheic keratosis: Secondary | ICD-10-CM | POA: Diagnosis not present

## 2016-05-24 DIAGNOSIS — C61 Malignant neoplasm of prostate: Secondary | ICD-10-CM | POA: Diagnosis not present

## 2016-08-02 ENCOUNTER — Encounter: Payer: Self-pay | Admitting: Gastroenterology

## 2016-10-23 DIAGNOSIS — C61 Malignant neoplasm of prostate: Secondary | ICD-10-CM | POA: Diagnosis not present

## 2016-10-30 DIAGNOSIS — N5201 Erectile dysfunction due to arterial insufficiency: Secondary | ICD-10-CM | POA: Diagnosis not present

## 2016-10-30 DIAGNOSIS — C61 Malignant neoplasm of prostate: Secondary | ICD-10-CM | POA: Diagnosis not present

## 2016-11-07 DIAGNOSIS — H52223 Regular astigmatism, bilateral: Secondary | ICD-10-CM | POA: Diagnosis not present

## 2016-11-07 DIAGNOSIS — H2513 Age-related nuclear cataract, bilateral: Secondary | ICD-10-CM | POA: Diagnosis not present

## 2016-11-07 DIAGNOSIS — H5213 Myopia, bilateral: Secondary | ICD-10-CM | POA: Diagnosis not present

## 2016-11-07 DIAGNOSIS — H524 Presbyopia: Secondary | ICD-10-CM | POA: Diagnosis not present

## 2016-12-21 ENCOUNTER — Ambulatory Visit (INDEPENDENT_AMBULATORY_CARE_PROVIDER_SITE_OTHER): Payer: PPO | Admitting: Family Medicine

## 2016-12-21 ENCOUNTER — Encounter: Payer: Self-pay | Admitting: Family Medicine

## 2016-12-21 VITALS — BP 132/75 | HR 59 | Temp 98.1°F | Ht 71.0 in | Wt 180.6 lb

## 2016-12-21 DIAGNOSIS — Z5181 Encounter for therapeutic drug level monitoring: Secondary | ICD-10-CM

## 2016-12-21 DIAGNOSIS — Z0000 Encounter for general adult medical examination without abnormal findings: Secondary | ICD-10-CM

## 2016-12-21 DIAGNOSIS — Z131 Encounter for screening for diabetes mellitus: Secondary | ICD-10-CM

## 2016-12-21 DIAGNOSIS — E785 Hyperlipidemia, unspecified: Secondary | ICD-10-CM | POA: Diagnosis not present

## 2016-12-21 DIAGNOSIS — Z130 Encounter for screening for diseases of the blood and blood-forming organs and certain disorders involving the immune mechanism: Secondary | ICD-10-CM

## 2016-12-21 DIAGNOSIS — Z23 Encounter for immunization: Secondary | ICD-10-CM

## 2016-12-21 DIAGNOSIS — L853 Xerosis cutis: Secondary | ICD-10-CM

## 2016-12-21 LAB — LIPID PANEL
Cholesterol: 243 mg/dL — ABNORMAL HIGH (ref 0–200)
HDL: 57.6 mg/dL (ref >39.00)
LDL Cholesterol: 164 mg/dL — ABNORMAL HIGH (ref 0–99)
NonHDL: 185.59
TRIGLYCERIDES: 109 mg/dL (ref 0.0–149.0)
Total CHOL/HDL Ratio: 4
VLDL: 21.8 mg/dL (ref 0.0–40.0)

## 2016-12-21 LAB — COMPREHENSIVE METABOLIC PANEL
ALBUMIN: 4.5 g/dL (ref 3.5–5.2)
ALK PHOS: 82 U/L (ref 39–117)
ALT: 30 U/L (ref 0–53)
AST: 29 U/L (ref 0–37)
BILIRUBIN TOTAL: 0.7 mg/dL (ref 0.2–1.2)
BUN: 21 mg/dL (ref 6–23)
CALCIUM: 10 mg/dL (ref 8.4–10.5)
CO2: 29 mEq/L (ref 19–32)
Chloride: 101 mEq/L (ref 96–112)
Creatinine, Ser: 1.12 mg/dL (ref 0.40–1.50)
GFR: 68 mL/min (ref >60.00)
Glucose, Bld: 85 mg/dL (ref 70–99)
Potassium: 4.1 mEq/L (ref 3.5–5.1)
Sodium: 139 mEq/L (ref 135–145)
TOTAL PROTEIN: 7.3 g/dL (ref 6.0–8.3)

## 2016-12-21 LAB — CBC
HCT: 40.6 % (ref 39.0–52.0)
HEMOGLOBIN: 13.7 g/dL (ref 13.0–17.0)
MCHC: 33.8 g/dL (ref 30.0–36.0)
MCV: 89 fl (ref 78.0–100.0)
PLATELETS: 233 10*3/uL (ref 150.0–400.0)
RBC: 4.56 Mil/uL (ref 4.22–5.81)
RDW: 13.9 % (ref 11.5–15.5)
WBC: 6.2 10*3/uL (ref 4.0–10.5)

## 2016-12-21 NOTE — Patient Instructions (Signed)
It was a pleasure to see you today!  I will be in touch with your labs  For dry skin on your feet- a urea based foot cream can be helpful, as can applying a thick moisturizer under socks before bed.  A cuticle oil can be helpful for dry cuticles  If the area on your scrotum does not continue to go back to normal please let me know

## 2016-12-21 NOTE — Progress Notes (Signed)
[place] at [facility] [address], [place], [place] [address] [phone]  Date:  12/21/2016   Name:  Parker Harrison   DOB:  03/25/1941   MRN:  [id number]  PCP:  Parker Blinks, MD    Chief Complaint: No chief complaint on file.   History of Present Illness:  Parker Harrison is a 76 y.o. very pleasant male patient who presents with the following:  Here today to establish primary care History of prostate caner- he had seed implants and all has done well. This occurred in 2014- he sees Parker Harrison on a regular basis and his PSA has remained undetectable He does have a history of borderline high cholesterol- not on any meds as his numbers are moderate and his other risk factors are low  He is doing very well for his age.  He is active and generally feel great.Marland Kitchen  He does have some issues with dry skin- generally worse this time of year.  Notes that his feet and hands can be very dry and scaly He had a ?bruise on his scrotum that he noticed a month ago or so.  He thinks that maybe he got hit with a tennis ball or pinched the skin in his shorts.  It was never sore.  The bruise seems to be going away- he thinks it is ok but would like for [place] to check as he now has a few little red spots in the area  He is retired from the work force He and his wife have a daughter in [place] who has 2 children herself and a son in [place] His wife has depression- she is otherwise in good health.  She does see a psychiatrist.  This has been an issue off an on for her over her life and is in a bad phase right now  He has not eaten in the last 7 hours so we do his labs today His knees are doing well  He is due for a plain Td vaccine today- would like to update Declines a flu shot today  He enjoys tennis 3x a week and also exercising at the gym Last colon in 2012- he was given a 10 year clearance   Patient Active Problem List   Diagnosis Date Noted  .  Hearing loss 05/25/2015  . Adenocarcinoma of prostate ([place]) 02/18/2013  . Hypercholesteremia 11/24/2012  . Personal history of colonic polyps 11/22/2012  . Left knee pain 08/05/2012  . Hamstring tightness 11/14/2011  . Quadriceps tendon rupture 08/16/2011  . Knee pain, right 07/20/2011  . Tear of meniscus, knee, medial 07/20/2011    Past Medical History:  Diagnosis Date  . Adenomatous polyp of colon   . Hearing aid worn    BILATERAL  . Hypercholesterolemia    NO MEDS  . Prostate cancer [facility])     Past Surgical History:  Procedure Laterality Date  . APPENDECTOMY    . COLONOSCOPY    . LEG WOUND REPAIR / CLOSURE Left 2000   INJURY  . PROSTATE BIOPSY    . PROSTATE SURGERY    . RADIOACTIVE SEED IMPLANT N/A 05/29/2013   Procedure: RADIOACTIVE SEED IMPLANT;  Surgeon: Parker Gallo, MD;  Location: [facility];  Service: Urology;  Laterality: N/A;  . TONSILLECTOMY AND ADENOIDECTOMY  AGE 64  . VASECTOMY      Social History  Substance Use Topics  . Smoking status: Never Smoker  . Smokeless tobacco:  Never Used  . Alcohol use 3.6 oz/week    4 Glasses of wine, 2 Cans of beer per week     Comment: 5-6 drinks    Family History  Problem Relation Age of Onset  . Sudden death Neg Hx   . Hypertension Neg Hx   . Heart attack Neg Hx   . Diabetes Neg Hx   . Prostate cancer Father   . Prostate cancer Brother   . Cancer Brother   . Hyperlipidemia Brother   . Dementia Mother   . Cancer Brother   . Hyperlipidemia Brother     No Known Allergies  Medication list has been reviewed and updated.  Current Outpatient Prescriptions on File Prior to Visit  Medication Sig Dispense Refill  . naproxen sodium (ANAPROX) 220 MG tablet Take 220 mg by mouth as needed.      No current facility-administered medications on file prior to visit.     Review of Systems: No CP, SOB,fever, chills, rash, nausea, vomiting, diarrhea, loss of appetite or sore throat, cough As per HPI-  otherwise negative.   Physical Examination: Vitals:   12/21/16 1324  BP: 132/75  Pulse: (!) 59  Temp: 98.1 F (36.7 C)    GEN: WDWN, NAD, Non-toxic, A & O x 3, looks well HEENT: Atraumatic, Normocephalic. Neck supple. No masses, No LAD.  Bilateral TM wnl, oropharynx normal.  PEERL,EOMI.   Ears and Nose: No external deformity. CV: RRR, No M/G/R. No JVD. No thrill. No extra heart sounds. PULM: CTA B, no wheezes, crackles, rhonchi. No retractions. No resp. distress. No accessory muscle use. ABD: S, NT, ND, +BS. No rebound. No HSM. EXTR: No c/c/e NEURO Normal gait.  PSYCH: Normally interactive. Conversant. Not depressed or anxious appearing.  Calm demeanor.  The skin of the left scrotum shows a few blanching red spots- appear similar to cherry angiomas and may also have resulted from recent trauma to the skin.  No testicular masses   Assessment and Plan: Physical exam  Screening for deficiency anemia - Plan: CBC  Screening for diabetes mellitus - Plan: Comprehensive metabolic panel  Medication monitoring encounter - Plan: CBC, Comprehensive metabolic panel  Dry skin  Immunization due - Plan: Td vaccine greater than or equal to 7yo preservative free IM  Dyslipidemia - Plan: Lipid panel  Here today for a CPE Labs as above Td vaccine is due today, updated Discussed ways to combat dry skin- avoidance of long hot showers, heavy moisturizers, removal of old dead skin cells Will plan further follow- up pending labs. Continue healthy active lifestyle   Signed Parker Blinks, MD

## 2017-01-01 DIAGNOSIS — L820 Inflamed seborrheic keratosis: Secondary | ICD-10-CM | POA: Diagnosis not present

## 2017-01-01 DIAGNOSIS — D1801 Hemangioma of skin and subcutaneous tissue: Secondary | ICD-10-CM | POA: Diagnosis not present

## 2017-01-01 DIAGNOSIS — Z85828 Personal history of other malignant neoplasm of skin: Secondary | ICD-10-CM | POA: Diagnosis not present

## 2017-01-01 DIAGNOSIS — L821 Other seborrheic keratosis: Secondary | ICD-10-CM | POA: Diagnosis not present

## 2017-01-01 DIAGNOSIS — L570 Actinic keratosis: Secondary | ICD-10-CM | POA: Diagnosis not present

## 2017-01-01 DIAGNOSIS — D2272 Melanocytic nevi of left lower limb, including hip: Secondary | ICD-10-CM | POA: Diagnosis not present

## 2017-01-15 ENCOUNTER — Ambulatory Visit (INDEPENDENT_AMBULATORY_CARE_PROVIDER_SITE_OTHER): Payer: PPO | Admitting: Family Medicine

## 2017-01-15 ENCOUNTER — Encounter: Payer: Self-pay | Admitting: Family Medicine

## 2017-01-15 VITALS — BP 111/71 | HR 92 | Temp 98.6°F | Ht 71.0 in | Wt 184.8 lb

## 2017-01-15 DIAGNOSIS — I781 Nevus, non-neoplastic: Secondary | ICD-10-CM

## 2017-01-15 DIAGNOSIS — E785 Hyperlipidemia, unspecified: Secondary | ICD-10-CM

## 2017-01-15 DIAGNOSIS — D1801 Hemangioma of skin and subcutaneous tissue: Secondary | ICD-10-CM

## 2017-01-15 NOTE — Patient Instructions (Addendum)
Please do see you urologist- he may want to do an ultrasound on the area in question. If he wants you to see dermatology we can help you with this as needed

## 2017-01-15 NOTE — Progress Notes (Signed)
Pre visit review using our clinic review tool, if applicable. No additional management support is needed unless otherwise documented below in the visit note. 

## 2017-01-15 NOTE — Progress Notes (Signed)
[place] at [facility] [address] [place], [place] [address] [phone]  Date:  01/15/2017   Name:  Parker Harrison   DOB:  May 14, 1941   MRN:  [id number]  PCP:  Parker Blinks, MD    Chief Complaint: Follow-up (Pt here for f/u visit. Pt states that area on scrotum is still present from last visit, has not )   History of Present Illness:  Parker Harrison is a 76 y.o. very pleasant male patient who presents with the following:  I saw him last about 1 month ago. He had noted a few red spots on his scrotum at that time- thought they might be post- traumatic. They do not hurt, itch, or otherwise cause any sx but he is concerned about the appearance  He is seeing Dr. Zannie Cove in about one year to follow-up on history of prostate cancer. His treatment is complete and they are just observing him now.   His last visit with urology was prior to appearance of the spots that are concerning him His dry skin is much better with the body wash we recommended at last visit. His skin feels much more comfortable and less itchy  His lipids are a bit high- however he has a good HDL and is really not interested in cholesterol medication at this time.  He is active and exercises regularly  His wife continues to suffer from depression which is hard- otherwise all is good at home  No urinary sx Patient Active Problem List   Diagnosis Date Noted  . Hearing loss 05/25/2015  . Adenocarcinoma of prostate ([place]) 02/18/2013  . Hypercholesteremia 11/24/2012  . Personal history of colonic polyps 11/22/2012  . Left knee pain 08/05/2012  . Hamstring tightness 11/14/2011  . Quadriceps tendon rupture 08/16/2011  . Knee pain, right 07/20/2011  . Tear of meniscus, knee, medial 07/20/2011    Past Medical History:  Diagnosis Date  . Adenomatous polyp of colon   . Hearing aid worn    BILATERAL  . Hypercholesterolemia    NO MEDS  . Prostate cancer [facility])      Past Surgical History:  Procedure Laterality Date  . APPENDECTOMY    . COLONOSCOPY    . LEG WOUND REPAIR / CLOSURE Left 2000   INJURY  . PROSTATE BIOPSY    . PROSTATE SURGERY    . RADIOACTIVE SEED IMPLANT N/A 05/29/2013   Procedure: RADIOACTIVE SEED IMPLANT;  Surgeon: Franchot Gallo, MD;  Location: [facility];  Service: Urology;  Laterality: N/A;  . TONSILLECTOMY AND ADENOIDECTOMY  AGE 24  . VASECTOMY      Social History  Substance Use Topics  . Smoking status: Never Smoker  . Smokeless tobacco: Never Used  . Alcohol use 3.6 oz/week    4 Glasses of wine, 2 Cans of beer per week     Comment: 5-6 drinks    Family History  Problem Relation Age of Onset  . Prostate cancer Father   . Prostate cancer Brother   . Cancer Brother   . Hyperlipidemia Brother   . Dementia Mother   . Cancer Brother   . Hyperlipidemia Brother   . Sudden death Neg Hx   . Hypertension Neg Hx   . Heart attack Neg Hx   . Diabetes Neg Hx     No Known Allergies  Medication list has been reviewed and updated.  Current Outpatient Prescriptions on File Prior to Visit  Medication Sig Dispense Refill  . naproxen sodium (ANAPROX) 220 MG tablet Take 220 mg by mouth as needed.      No current facility-administered medications on file prior to visit.     Review of Systems:  As per HPI- otherwise negative. No fever, chills, UTI sx, weight change, CP or SOB  Physical Examination: Vitals:   01/15/17 0912  BP: 111/71  Pulse: 92  Temp: 98.6 F (37 C)   Vitals:   01/15/17 0912  Weight: 184 lb 12.8 oz (83.8 kg)  Height: 5\' 11"  (1.803 m)   Body mass index is 25.77 kg/m. Ideal Body Weight: Weight in (lb) to have BMI = 25: 178.9  GEN: WDWN, NAD, Non-toxic, A & O x 3, looks well, tall build, normal weight HEENT: Atraumatic, Normocephalic. Neck supple. No masses, No LAD. Ears and Nose: No external deformity. CV: RRR, No M/G/R. No JVD. No thrill. No extra heart sounds. PULM:  CTA B, no wheezes, crackles, rhonchi. No retractions. No resp. distress. No accessory muscle use. EXTR: No c/c/e NEURO Normal gait.  PSYCH: Normally interactive. Conversant. Not depressed or anxious appearing.  Calm demeanor.  GU: overlying the left testicle the scrotum displays a cluster of discrete, tiny (1-2 mm in diameter) blanching red spots.  Most c/w an angioma No mass or other skin lesion noted   Assessment and Plan: Cherry angioma  Dyslipidemia  Here with what seems to be a cluster of new cherry angiomas on his scrotum.  He thinks that these appeared a few weeks ago.  No other sx except he can see these spots.  This may indicate a vascular issue in the scrotum.  He will see his urologist for follow-up soon, will let me know if any other concern For the time being he declines cholesterol med, will continue to exercise and maintain a healthy lifestyle   Signed Parker Blinks, MD

## 2017-03-06 ENCOUNTER — Telehealth: Payer: Self-pay | Admitting: Family Medicine

## 2017-03-06 DIAGNOSIS — Z85828 Personal history of other malignant neoplasm of skin: Secondary | ICD-10-CM | POA: Diagnosis not present

## 2017-03-06 DIAGNOSIS — L820 Inflamed seborrheic keratosis: Secondary | ICD-10-CM | POA: Diagnosis not present

## 2017-03-06 DIAGNOSIS — D485 Neoplasm of uncertain behavior of skin: Secondary | ICD-10-CM | POA: Diagnosis not present

## 2017-03-06 NOTE — Telephone Encounter (Signed)
Left pt message asking to call Allison back directly at [phone] to schedule AWV. Thanks! °

## 2017-03-15 NOTE — Telephone Encounter (Signed)
Pt declined AWV. °

## 2017-10-26 DIAGNOSIS — C61 Malignant neoplasm of prostate: Secondary | ICD-10-CM | POA: Diagnosis not present

## 2017-11-07 DIAGNOSIS — H2513 Age-related nuclear cataract, bilateral: Secondary | ICD-10-CM | POA: Diagnosis not present

## 2017-11-12 DIAGNOSIS — C61 Malignant neoplasm of prostate: Secondary | ICD-10-CM | POA: Diagnosis not present

## 2018-01-05 NOTE — Progress Notes (Signed)
[facility] [address] [place], [place] [address] [phone]  Date:  01/07/2018   Name:  Parker Harrison   DOB:  11-12-1941   MRN:  [id number]  PCP:  Darreld Mclean, MD    Chief Complaint: Annual Exam (Pt here for CPE and fasting labs. )   History of Present Illness:  Parker Harrison is a 77 y.o. very pleasant male patient who presents with the following: "Parker Harrison" Here today for a CPE History of high cholesterol, prostate cancer Last physical here about a year ago: Here today to establish primary care History of prostate caner- he had seed implants and all has done well. This occurred in 2014- he sees Dahlstedt on a regular basis and his PSA has remained undetectable He does have a history of borderline high cholesterol- not on any meds as his numbers are moderate and his other risk factors are low He is doing very well for his age.  He is active and generally feel great.Marland Kitchen  He does have some issues with dry skin- generally worse this time of year.  Notes that his feet and hands can be very dry and scaly He had a ?bruise on his scrotum that he noticed a month ago or so.  He thinks that maybe he got hit with a tennis ball or pinched the skin in his shorts.  It was never sore.  The bruise seems to be going away- he thinks it is ok but would like for [place] to check as he now has a few little red spots in the area He is retired from the work force He and his wife have a daughter in [place] who has 2 children herself and a son in [place] His wife has depression- she is otherwise in good health.  She does see a psychiatrist.  This has been an issue off an on for her over her life and is in a bad phase right now He has not eaten in the last 7 hours so we do his labs today He enjoys tennis 3x a week and also exercising at the gym Last colon in 2012- he was given a 10 year clearance   Flu: declines  Pneumonia: he is UTD per my  understanding- had his pnemovax at about 77 yo and then prevnar 2 years ago  Shingles: he has not had this yet, but would like to have this at some point  Colon: UTD- he is due in 2022 Prostate: per his urologist Labs: will do today  He notes that his wife "is struggling" with her depression.  Otherwise all is ok with him He continues to play tennis- he plays at the [employer] and also also [place] and [place] He saw Dahlsted a couple of months ago and all is well with his prostate He is fasting today for labs   He continues to use the dry oil bath for his dry skin- he buys this online and it does seem to help  He has noted a bump on his right lateral knee- he seems to be getting better It does not generally hurt    Patient Active Problem List   Diagnosis Date Noted  . Hearing loss 05/25/2015  . Adenocarcinoma of prostate ([place]) 02/18/2013  . Hypercholesteremia 11/24/2012  . Personal history of colonic polyps 11/22/2012  . Quadriceps tendon rupture 08/16/2011  . Tear of meniscus, knee, medial 07/20/2011    Past  Medical History:  Diagnosis Date  . Adenomatous polyp of colon   . Hearing aid worn    BILATERAL  . Hypercholesterolemia    NO MEDS  . Prostate cancer [facility])     Past Surgical History:  Procedure Laterality Date  . APPENDECTOMY    . COLONOSCOPY    . LEG WOUND REPAIR / CLOSURE Left 2000   INJURY  . PROSTATE BIOPSY    . PROSTATE SURGERY    . RADIOACTIVE SEED IMPLANT N/A 05/29/2013   Procedure: RADIOACTIVE SEED IMPLANT;  Surgeon: Franchot Gallo, MD;  Location: [facility];  Service: Urology;  Laterality: N/A;  . TONSILLECTOMY AND ADENOIDECTOMY  AGE 58  . VASECTOMY      Social History   Tobacco Use  . Smoking status: Never Smoker  . Smokeless tobacco: Never Used  Substance Use Topics  . Alcohol use: Yes    Alcohol/week: 3.6 oz    Types: 4 Glasses of wine, 2 Cans of beer per week    Comment: 5-6 drinks  . Drug use: No    Family  History  Problem Relation Age of Onset  . Prostate cancer Father   . Prostate cancer Brother   . Cancer Brother   . Hyperlipidemia Brother   . Dementia Mother   . Cancer Brother   . Hyperlipidemia Brother   . Sudden death Neg Hx   . Hypertension Neg Hx   . Heart attack Neg Hx   . Diabetes Neg Hx     No Known Allergies  Medication list has been reviewed and updated.  Current Outpatient Medications on File Prior to Visit  Medication Sig Dispense Refill  . naproxen sodium (ANAPROX) 220 MG tablet Take 220 mg by mouth as needed.      No current facility-administered medications on file prior to visit.     Review of Systems:  As per HPI- otherwise negative. No CP or SOB with exercise- he plays tennis on a regular basis    Physical Examination: Vitals:   01/07/18 0823  BP: 128/82  Pulse: 64  Temp: 97.6 F (36.4 C)  SpO2: 98%   Vitals:   01/07/18 0823  Weight: 187 lb 12.8 oz (85.2 kg)  Height: 6' (1.829 m)   Body mass index is 25.47 kg/m. Ideal Body Weight: Weight in (lb) to have BMI = 25: 183.9  GEN: WDWN, NAD, Non-toxic, A & O x 3, normal weight, looks well  HEENT: Atraumatic, Normocephalic. Neck supple. No masses, No LAD. Hearing aids bilaterally, PEERL, oropharynx wnl  Ears and Nose: No external deformity. CV: RRR, No M/G/R. No JVD. No thrill. No extra heart sounds. PULM: CTA B, no wheezes, crackles, rhonchi. No retractions. No resp. distress. No accessory muscle use. ABD: S, NT, ND. No rebound. No HSM. EXTR: No c/c/e NEURO Normal gait.  PSYCH: Normally interactive. Conversant. Not depressed or anxious appearing.  Calm demeanor.  Right knee:  Normal ROM, no effusion, redness or swelling  He has a minimally palpable thickening at the right distal quad insertion, lateral. This feels like it may be a subque cyst Normal quad and hamstrings strength, normal DTR bilaterally     Assessment and Plan: Physical exam  Dyslipidemia - Plan: Lipid panel  Screening  for deficiency anemia - Plan: CBC  Screening for diabetes mellitus - Plan: Comprehensive metabolic panel  Medication monitoring encounter - Plan: CBC, Comprehensive metabolic panel  Here today for a CPE He remains in good health, active, plays tennis  Will  plan further follow- up pending labs. Encouraged shingrix Went over health maint   Signed Lamar Blinks, MD Received his labs:  Message to pt  Your blood count and metabolic profile look great Although your cholesterol is certainly not terrible, I calculated your estimated 10 year risk of cardiovascular disease at about 26%. A cholesterol medication may decrease this risk for you- is this something you would be interested in?  You can let me know here. Otherwise plan for a physical in one year.   Results for orders placed or performed in visit on 01/07/18  CBC  Result Value Ref Range   WBC 4.1 4.0 - 10.5 K/uL   RBC 4.65 4.22 - 5.81 Mil/uL   Platelets 216.0 150.0 - 400.0 K/uL   Hemoglobin 14.0 13.0 - 17.0 g/dL   HCT 42.0 39.0 - 52.0 %   MCV 90.4 78.0 - 100.0 fl   MCHC 33.2 30.0 - 36.0 g/dL   RDW 13.6 11.5 - 15.5 %  Comprehensive metabolic panel  Result Value Ref Range   Sodium 138 135 - 145 mEq/L   Potassium 4.3 3.5 - 5.1 mEq/L   Chloride 101 96 - 112 mEq/L   CO2 31 19 - 32 mEq/L   Glucose, Bld 99 70 - 99 mg/dL   BUN 19 6 - 23 mg/dL   Creatinine, Ser 1.14 0.40 - 1.50 mg/dL   Total Bilirubin 0.6 0.2 - 1.2 mg/dL   Alkaline Phosphatase 77 39 - 117 U/L   AST 26 0 - 37 U/L   ALT 29 0 - 53 U/L   Total Protein 7.2 6.0 - 8.3 g/dL   Albumin 4.3 3.5 - 5.2 g/dL   Calcium 9.6 8.4 - 10.5 mg/dL   GFR 66.44 >60.00 mL/min  Lipid panel  Result Value Ref Range   Cholesterol 229 (H) 0 - 200 mg/dL   Triglycerides 95.0 0.0 - 149.0 mg/dL   HDL 53.40 >39.00 mg/dL   VLDL 19.0 0.0 - 40.0 mg/dL   LDL Cholesterol 157 (H) 0 - 99 mg/dL   Total CHOL/HDL Ratio 4    NonHDL 175.89

## 2018-01-07 ENCOUNTER — Ambulatory Visit (INDEPENDENT_AMBULATORY_CARE_PROVIDER_SITE_OTHER): Payer: PPO | Admitting: Family Medicine

## 2018-01-07 ENCOUNTER — Encounter: Payer: Self-pay | Admitting: Family Medicine

## 2018-01-07 VITALS — BP 128/82 | HR 64 | Temp 97.6°F | Ht 72.0 in | Wt 187.8 lb

## 2018-01-07 DIAGNOSIS — E785 Hyperlipidemia, unspecified: Secondary | ICD-10-CM | POA: Diagnosis not present

## 2018-01-07 DIAGNOSIS — Z5181 Encounter for therapeutic drug level monitoring: Secondary | ICD-10-CM

## 2018-01-07 DIAGNOSIS — Z130 Encounter for screening for diseases of the blood and blood-forming organs and certain disorders involving the immune mechanism: Secondary | ICD-10-CM | POA: Diagnosis not present

## 2018-01-07 DIAGNOSIS — Z131 Encounter for screening for diabetes mellitus: Secondary | ICD-10-CM | POA: Diagnosis not present

## 2018-01-07 DIAGNOSIS — Z0000 Encounter for general adult medical examination without abnormal findings: Secondary | ICD-10-CM | POA: Diagnosis not present

## 2018-01-07 LAB — LIPID PANEL
Cholesterol: 229 mg/dL — ABNORMAL HIGH (ref 0–200)
HDL: 53.4 mg/dL (ref >39.00)
LDL Cholesterol: 157 mg/dL — ABNORMAL HIGH (ref 0–99)
NONHDL: 175.89
Total CHOL/HDL Ratio: 4
Triglycerides: 95 mg/dL (ref 0.0–149.0)
VLDL: 19 mg/dL (ref 0.0–40.0)

## 2018-01-07 LAB — COMPREHENSIVE METABOLIC PANEL
ALT: 29 U/L (ref 0–53)
AST: 26 U/L (ref 0–37)
Albumin: 4.3 g/dL (ref 3.5–5.2)
Alkaline Phosphatase: 77 U/L (ref 39–117)
BILIRUBIN TOTAL: 0.6 mg/dL (ref 0.2–1.2)
BUN: 19 mg/dL (ref 6–23)
CALCIUM: 9.6 mg/dL (ref 8.4–10.5)
CO2: 31 mEq/L (ref 19–32)
Chloride: 101 mEq/L (ref 96–112)
Creatinine, Ser: 1.14 mg/dL (ref 0.40–1.50)
GFR: 66.44 mL/min (ref >60.00)
Glucose, Bld: 99 mg/dL (ref 70–99)
POTASSIUM: 4.3 meq/L (ref 3.5–5.1)
Sodium: 138 mEq/L (ref 135–145)
TOTAL PROTEIN: 7.2 g/dL (ref 6.0–8.3)

## 2018-01-07 LAB — CBC
HEMATOCRIT: 42 % (ref 39.0–52.0)
HEMOGLOBIN: 14 g/dL (ref 13.0–17.0)
MCHC: 33.2 g/dL (ref 30.0–36.0)
MCV: 90.4 fl (ref 78.0–100.0)
PLATELETS: 216 10*3/uL (ref 150.0–400.0)
RBC: 4.65 Mil/uL (ref 4.22–5.81)
RDW: 13.6 % (ref 11.5–15.5)
WBC: 4.1 10*3/uL (ref 4.0–10.5)

## 2018-01-07 NOTE — Patient Instructions (Signed)
A pleasure to see you today as always!  Take care and I will be in touch with your labs Let me know if you have any concerns about the area on your right leg, but it seems to be a benign finding You may want to have the Shingrix vaccine (2 shot series) at your convenience.  This is a better, more effective shot than the zostavax which we had in the past    Health Maintenance, Male A healthy lifestyle and preventive care is important for your health and wellness. Ask your health care provider about what schedule of regular examinations is right for you. What should I know about weight and diet? Eat a Healthy Diet  Eat plenty of vegetables, fruits, whole grains, low-fat dairy products, and lean protein.  Do not eat a lot of foods high in solid fats, added sugars, or salt.  Maintain a Healthy Weight Regular exercise can help you achieve or maintain a healthy weight. You should:  Do at least 150 minutes of exercise each week. The exercise should increase your heart rate and make you sweat (moderate-intensity exercise).  Do strength-training exercises at least twice a week.  Watch Your Levels of Cholesterol and Blood Lipids  Have your blood tested for lipids and cholesterol every 5 years starting at 76 years of age. If you are at high risk for heart disease, you should start having your blood tested when you are 76 years old. You may need to have your cholesterol levels checked more often if: ? Your lipid or cholesterol levels are high. ? You are older than 76 years of age. ? You are at high risk for heart disease.  What should I know about cancer screening? Many types of cancers can be detected early and may often be prevented. Lung Cancer  You should be screened every year for lung cancer if: ? You are a current smoker who has smoked for at least 30 years. ? You are a former smoker who has quit within the past 15 years.  Talk to your health care provider about your screening options,  when you should start screening, and how often you should be screened.  Colorectal Cancer  Routine colorectal cancer screening usually begins at 76 years of age and should be repeated every 5-10 years until you are 76 years old. You may need to be screened more often if early forms of precancerous polyps or small growths are found. Your health care provider may recommend screening at an earlier age if you have risk factors for colon cancer.  Your health care provider may recommend using home test kits to check for hidden blood in the stool.  A small camera at the end of a tube can be used to examine your colon (sigmoidoscopy or colonoscopy). This checks for the earliest forms of colorectal cancer.  Prostate and Testicular Cancer  Depending on your age and overall health, your health care provider may do certain tests to screen for prostate and testicular cancer.  Talk to your health care provider about any symptoms or concerns you have about testicular or prostate cancer.  Skin Cancer  Check your skin from head to toe regularly.  Tell your health care provider about any new moles or changes in moles, especially if: ? There is a change in a mole's size, shape, or color. ? You have a mole that is larger than a pencil eraser.  Always use sunscreen. Apply sunscreen liberally and repeat throughout the day.  Protect  yourself by wearing long sleeves, pants, a wide-brimmed hat, and sunglasses when outside.  What should I know about heart disease, diabetes, and high blood pressure?  If you are 54-20 years of age, have your blood pressure checked every 3-5 years. If you are 22 years of age or older, have your blood pressure checked every year. You should have your blood pressure measured twice-once when you are at a hospital or clinic, and once when you are not at a hospital or clinic. Record the average of the two measurements. To check your blood pressure when you are not at a hospital or  clinic, you can use: ? An automated blood pressure machine at a pharmacy. ? A home blood pressure monitor.  Talk to your health care provider about your target blood pressure.  If you are between 64-41 years old, ask your health care provider if you should take aspirin to prevent heart disease.  Have regular diabetes screenings by checking your fasting blood sugar level. ? If you are at a normal weight and have a low risk for diabetes, have this test once every three years after the age of 32. ? If you are overweight and have a high risk for diabetes, consider being tested at a younger age or more often.  A one-time screening for abdominal aortic aneurysm (AAA) by ultrasound is recommended for men aged 65-75 years who are current or former smokers. What should I know about preventing infection? Hepatitis B If you have a higher risk for hepatitis B, you should be screened for this virus. Talk with your health care provider to find out if you are at risk for hepatitis B infection. Hepatitis C Blood testing is recommended for:  Everyone born from [birth year] through 1964.  Anyone with known risk factors for hepatitis C.  Sexually Transmitted Diseases (STDs)  You should be screened each year for STDs including gonorrhea and chlamydia if: ? You are sexually active and are younger than 76 years of age. ? You are older than 76 years of age and your health care provider tells you that you are at risk for this type of infection. ? Your sexual activity has changed since you were last screened and you are at an increased risk for chlamydia or gonorrhea. Ask your health care provider if you are at risk.  Talk with your health care provider about whether you are at high risk of being infected with HIV. Your health care provider may recommend a prescription medicine to help prevent HIV infection.  What else can I do?  Schedule regular health, dental, and eye exams.  Stay current with your vaccines  (immunizations).  Do not use any tobacco products, such as cigarettes, chewing tobacco, and e-cigarettes. If you need help quitting, ask your health care provider.  Limit alcohol intake to no more than 2 drinks per day. One drink equals 12 ounces of beer, 5 ounces of wine, or 1 ounces of hard liquor.  Do not use street drugs.  Do not share needles.  Ask your health care provider for help if you need support or information about quitting drugs.  Tell your health care provider if you often feel depressed.  Tell your health care provider if you have ever been abused or do not feel safe at home. This information is not intended to replace advice given to you by your health care provider. Make sure you discuss any questions you have with your health care provider. Document Released: 05/25/2008 Document Revised:  07/26/2016 Document Reviewed: 08/31/2015 Elsevier Interactive Patient Education  [employer].

## 2018-02-19 DIAGNOSIS — Z85828 Personal history of other malignant neoplasm of skin: Secondary | ICD-10-CM | POA: Diagnosis not present

## 2018-02-19 DIAGNOSIS — L570 Actinic keratosis: Secondary | ICD-10-CM | POA: Diagnosis not present

## 2018-02-19 DIAGNOSIS — L821 Other seborrheic keratosis: Secondary | ICD-10-CM | POA: Diagnosis not present

## 2018-08-20 DIAGNOSIS — Z85828 Personal history of other malignant neoplasm of skin: Secondary | ICD-10-CM | POA: Diagnosis not present

## 2018-08-20 DIAGNOSIS — L821 Other seborrheic keratosis: Secondary | ICD-10-CM | POA: Diagnosis not present

## 2018-08-20 DIAGNOSIS — L570 Actinic keratosis: Secondary | ICD-10-CM | POA: Diagnosis not present

## 2018-11-29 DIAGNOSIS — C61 Malignant neoplasm of prostate: Secondary | ICD-10-CM | POA: Diagnosis not present

## 2018-12-06 DIAGNOSIS — C61 Malignant neoplasm of prostate: Secondary | ICD-10-CM | POA: Diagnosis not present

## 2019-02-01 NOTE — Progress Notes (Addendum)
[place] [facility] [address], [place], [place] [address] [phone]  Date:  02/05/2019   Name:  Parker Harrison   DOB:  1941-10-21   MRN:  [id number]  PCP:  Darreld Mclean, MD    Chief Complaint: Annual Exam   History of Present Illness:  Parker Harrison is a 78 y.o. very pleasant male patient who presents with the following:  Gentleman with history of high cholesterol, prostate cancer, hearing loss.  Here today for complete physical He has prostate cancer was diagnosed in 2014, he had radioactive seed implants and has done well Last seen by myself about 1 year ago  He is retired, he has a daughter in [place] and a son in [place].  2 grandchildren- 71 and 33 yo.   He enjoys tennis and gym exercise He will exercise 6 days a week-he may play tennis for 1/2 to 2 hours, does weightlifting classes and yoga at the gym No CP or SOB with exercise   Colon cancer screening: 2012-he was asked to come back in 5 years.  He is now due Prostate cancer: I reviewed his most recent urology note from December 2019.  All looks okay- he has annual visits now.  psa per urology  Labs: Due today Immunizations: Had both pneumonia vaccines.  Tetanus is up-to-date Question flu Shingrix- recommend that he have this done  No falls, no depression sx  He is a never smoker, has 1-1.5 glasses of wine most days   Patient Active Problem List   Diagnosis Date Noted  . Hearing loss 05/25/2015  . Adenocarcinoma of prostate ([place]) 02/18/2013  . Hypercholesteremia 11/24/2012  . Personal history of colonic polyps 11/22/2012  . Quadriceps tendon rupture 08/16/2011  . Tear of meniscus, knee, medial 07/20/2011    Past Medical History:  Diagnosis Date  . Adenomatous polyp of colon   . Hearing aid worn    BILATERAL  . Hypercholesterolemia    NO MEDS  . Prostate cancer [facility])     Past Surgical History:  Procedure Laterality Date  . APPENDECTOMY     . COLONOSCOPY    . LEG WOUND REPAIR / CLOSURE Left 2000   INJURY  . PROSTATE BIOPSY    . PROSTATE SURGERY    . RADIOACTIVE SEED IMPLANT N/A 05/29/2013   Procedure: RADIOACTIVE SEED IMPLANT;  Surgeon: Franchot Gallo, MD;  Location: [facility];  Service: Urology;  Laterality: N/A;  . TONSILLECTOMY AND ADENOIDECTOMY  AGE 28  . VASECTOMY      Social History   Tobacco Use  . Smoking status: Never Smoker  . Smokeless tobacco: Never Used  Substance Use Topics  . Alcohol use: Yes    Alcohol/week: 6.0 standard drinks    Types: 4 Glasses of wine, 2 Cans of beer per week    Comment: 5-6 drinks  . Drug use: No    Family History  Problem Relation Age of Onset  . Prostate cancer Father   . Prostate cancer Brother   . Cancer Brother   . Hyperlipidemia Brother   . Dementia Mother   . Cancer Brother   . Hyperlipidemia Brother   . Sudden death Neg Hx   . Hypertension Neg Hx   . Heart attack Neg Hx   . Diabetes Neg Hx     No Known Allergies  Medication list has been reviewed and updated.  Current Outpatient Medications on File Prior to  Visit  Medication Sig Dispense Refill  . naproxen sodium (ANAPROX) 220 MG tablet Take 220 mg by mouth as needed.      No current facility-administered medications on file prior to visit.     Review of Systems:  As per HPI- otherwise negative. Notes that he feels great No skin changes or concerns No ankle swelling   BP Readings from Last 3 Encounters:  02/05/19 132/86  01/07/18 128/82  01/15/17 111/71    Physical Examination: Vitals:   02/05/19 0813  BP: 132/86  Pulse: 63  Temp: 97.6 F (36.4 C)  SpO2: 97%   Vitals:   02/05/19 0813  Weight: 184 lb (83.5 kg)  Height: 5\' 11"  (1.803 m)   Body mass index is 25.66 kg/m. Ideal Body Weight: Weight in (lb) to have BMI = 25: 178.9  GEN: WDWN, NAD, Non-toxic, A & O x 3.  Normal weight, looks well HEENT: Atraumatic, Normocephalic. Neck supple. No masses, No  LAD. Ears and Nose: No external deformity. CV: RRR, No M/G/R. No JVD. No thrill. No extra heart sounds. PULM: CTA B, no wheezes, crackles, rhonchi. No retractions. No resp. distress. No accessory muscle use. ABD: S, NT, ND, +BS. No rebound. No HSM. EXTR: No c/c/e NEURO Normal gait.  PSYCH: Normally interactive. Conversant. Not depressed or anxious appearing.  Calm demeanor.    Assessment and Plan: Physical exam  Dyslipidemia - Plan: Lipid panel  Screening for deficiency anemia - Plan: CBC  Screening for diabetes mellitus - Plan: Comprehensive metabolic panel, Hemoglobin A1c  Screening for colon cancer  Here today for complete physical exam.  Labs pending as above.  Encouraged him to have the shingles series at his drugstore.  He declines a flu shot for now Encouraged him to continue his healthy habits with exercise.  Assuming his labs are fine, can recheck in 1 year  Signed Lamar Blinks, MD  Received his labs, message to patient  Results for orders placed or performed in visit on 02/05/19  CBC  Result Value Ref Range   WBC 4.0 4.0 - 10.5 K/uL   RBC 4.68 4.22 - 5.81 Mil/uL   Platelets 221.0 150.0 - 400.0 K/uL   Hemoglobin 14.4 13.0 - 17.0 g/dL   HCT 42.4 39.0 - 52.0 %   MCV 90.6 78.0 - 100.0 fl   MCHC 34.0 30.0 - 36.0 g/dL   RDW 13.5 11.5 - 15.5 %  Comprehensive metabolic panel  Result Value Ref Range   Sodium 137 135 - 145 mEq/L   Potassium 4.3 3.5 - 5.1 mEq/L   Chloride 99 96 - 112 mEq/L   CO2 28 19 - 32 mEq/L   Glucose, Bld 85 70 - 99 mg/dL   BUN 19 6 - 23 mg/dL   Creatinine, Ser 1.21 0.40 - 1.50 mg/dL   Total Bilirubin 1.0 0.2 - 1.2 mg/dL   Alkaline Phosphatase 87 39 - 117 U/L   AST 27 0 - 37 U/L   ALT 25 0 - 53 U/L   Total Protein 7.1 6.0 - 8.3 g/dL   Albumin 4.5 3.5 - 5.2 g/dL   Calcium 9.6 8.4 - 10.5 mg/dL   GFR 58.19 (L) >60.00 mL/min  Lipid panel  Result Value Ref Range   Cholesterol 250 (H) 0 - 200 mg/dL   Triglycerides 102.0 0.0 - 149.0 mg/dL    HDL 59.70 >39.00 mg/dL   VLDL 20.4 0.0 - 40.0 mg/dL   LDL Cholesterol 170 (H) 0 - 99 mg/dL   Total CHOL/HDL  Ratio 4    NonHDL 190.57   Hemoglobin A1c  Result Value Ref Range   Hgb A1c MFr Bld 5.5 4.6 - 6.5 %   The 10-year ASCVD risk score Mikey Bussing DC Jr., et al., 2013) is: 27.2%   Values used to calculate the score:     Age: 48 years     Sex: Male     Is Non-Hispanic African American: No     Diabetic: No     Tobacco smoker: No     Systolic Blood Pressure: 301 mmHg     Is BP treated: No     HDL Cholesterol: 59.7 mg/dL     Total Cholesterol: 250 mg/dL

## 2019-02-01 NOTE — Patient Instructions (Addendum)
It was great to see you today, I will be in touch with your labs It looks like you are actually due for colonoscopy, please contact your gastroenterologist- [facility]  [phone]  You might want to have the Shingrix vaccine, for shingles, at your drugstore-this is a 2 shot series with doses given 2-6 months apart  Please do get your flu shot next season!     Health Maintenance After Age 77 After age 42, you are at a higher risk for certain long-term diseases and infections as well as injuries from falls. Falls are a major cause of broken bones and head injuries in people who are older than age 35. Getting regular preventive care can help to keep you healthy and well. Preventive care includes getting regular testing and making lifestyle changes as recommended by your health care provider. Talk with your health care provider about:  Which screenings and tests you should have. A screening is a test that checks for a disease when you have no symptoms.  A diet and exercise plan that is right for you. What should I know about screenings and tests to prevent falls? Screening and testing are the best ways to find a health problem early. Early diagnosis and treatment give you the best chance of managing medical conditions that are common after age 16. Certain conditions and lifestyle choices may make you more likely to have a fall. Your health care provider may recommend:  Regular vision checks. Poor vision and conditions such as cataracts can make you more likely to have a fall. If you wear glasses, make sure to get your prescription updated if your vision changes.  Medicine review. Work with your health care provider to regularly review all of the medicines you are taking, including over-the-counter medicines. Ask your health care provider about any side effects that may make you more likely to have a fall. Tell your health care provider if any medicines that you take make you feel dizzy or  sleepy.  Osteoporosis screening. Osteoporosis is a condition that causes the bones to get weaker. This can make the bones weak and cause them to break more easily.  Blood pressure screening. Blood pressure changes and medicines to control blood pressure can make you feel dizzy.  Strength and balance checks. Your health care provider may recommend certain tests to check your strength and balance while standing, walking, or changing positions.  Foot health exam. Foot pain and numbness, as well as not wearing proper footwear, can make you more likely to have a fall.  Depression screening. You may be more likely to have a fall if you have a fear of falling, feel emotionally low, or feel unable to do activities that you used to do.  Alcohol use screening. Using too much alcohol can affect your balance and may make you more likely to have a fall. What actions can I take to lower my risk of falls? General instructions  Talk with your health care provider about your risks for falling. Tell your health care provider if: ? You fall. Be sure to tell your health care provider about all falls, even ones that seem minor. ? You feel dizzy, sleepy, or off-balance.  Take over-the-counter and prescription medicines only as told by your health care provider. These include any supplements.  Eat a healthy diet and maintain a healthy weight. A healthy diet includes low-fat dairy products, low-fat (lean) meats, and fiber from whole grains, beans, and lots of fruits and vegetables. Home  safety  Remove any tripping hazards, such as rugs, cords, and clutter.  Install safety equipment such as grab bars in bathrooms and safety rails on stairs.  Keep rooms and walkways well-lit. Activity   Follow a regular exercise program to stay fit. This will help you maintain your balance. Ask your health care provider what types of exercise are appropriate for you.  If you need a cane or walker, use it as recommended by  your health care provider.  Wear supportive shoes that have nonskid soles. Lifestyle  Do not drink alcohol if your health care provider tells you not to drink.  If you drink alcohol, limit how much you have: ? 0-1 drink a day for women. ? 0-2 drinks a day for men.  Be aware of how much alcohol is in your drink. In the [place] one drink equals one typical bottle of beer (12 oz), one-half glass of wine (5 oz), or one shot of hard liquor (1 oz).  Do not use any products that contain nicotine or tobacco, such as cigarettes and e-cigarettes. If you need help quitting, ask your health care provider. Summary  Having a healthy lifestyle and getting preventive care can help to protect your health and wellness after age 67.  Screening and testing are the best way to find a health problem early and help you avoid having a fall. Early diagnosis and treatment give you the best chance for managing medical conditions that are more common for people who are older than age 54.  Falls are a major cause of broken bones and head injuries in people who are older than age 63. Take precautions to prevent a fall at home.  Work with your health care provider to learn what changes you can make to improve your health and wellness and to prevent falls. This information is not intended to replace advice given to you by your health care provider. Make sure you discuss any questions you have with your health care provider. Document Released: 10/10/2017 Document Revised: 10/10/2017 Document Reviewed: 10/10/2017 Elsevier Interactive Patient Education  2019 [employer].

## 2019-02-05 ENCOUNTER — Ambulatory Visit (INDEPENDENT_AMBULATORY_CARE_PROVIDER_SITE_OTHER): Payer: PPO | Admitting: Family Medicine

## 2019-02-05 ENCOUNTER — Encounter: Payer: Self-pay | Admitting: Family Medicine

## 2019-02-05 VITALS — BP 130/78 | HR 63 | Temp 97.6°F | Ht 71.0 in | Wt 184.0 lb

## 2019-02-05 DIAGNOSIS — E785 Hyperlipidemia, unspecified: Secondary | ICD-10-CM | POA: Diagnosis not present

## 2019-02-05 DIAGNOSIS — Z0000 Encounter for general adult medical examination without abnormal findings: Secondary | ICD-10-CM

## 2019-02-05 DIAGNOSIS — Z1211 Encounter for screening for malignant neoplasm of colon: Secondary | ICD-10-CM

## 2019-02-05 DIAGNOSIS — Z131 Encounter for screening for diabetes mellitus: Secondary | ICD-10-CM | POA: Diagnosis not present

## 2019-02-05 DIAGNOSIS — Z130 Encounter for screening for diseases of the blood and blood-forming organs and certain disorders involving the immune mechanism: Secondary | ICD-10-CM | POA: Diagnosis not present

## 2019-02-05 LAB — COMPREHENSIVE METABOLIC PANEL
ALT: 25 U/L (ref 0–53)
AST: 27 U/L (ref 0–37)
Albumin: 4.5 g/dL (ref 3.5–5.2)
Alkaline Phosphatase: 87 U/L (ref 39–117)
BUN: 19 mg/dL (ref 6–23)
CALCIUM: 9.6 mg/dL (ref 8.4–10.5)
CHLORIDE: 99 meq/L (ref 96–112)
CO2: 28 meq/L (ref 19–32)
Creatinine, Ser: 1.21 mg/dL (ref 0.40–1.50)
GFR: 58.19 mL/min — ABNORMAL LOW (ref >60.00)
Glucose, Bld: 85 mg/dL (ref 70–99)
Potassium: 4.3 mEq/L (ref 3.5–5.1)
Sodium: 137 mEq/L (ref 135–145)
Total Bilirubin: 1 mg/dL (ref 0.2–1.2)
Total Protein: 7.1 g/dL (ref 6.0–8.3)

## 2019-02-05 LAB — LIPID PANEL
CHOL/HDL RATIO: 4
Cholesterol: 250 mg/dL — ABNORMAL HIGH (ref 0–200)
HDL: 59.7 mg/dL (ref >39.00)
LDL Cholesterol: 170 mg/dL — ABNORMAL HIGH (ref 0–99)
NonHDL: 190.57
TRIGLYCERIDES: 102 mg/dL (ref 0.0–149.0)
VLDL: 20.4 mg/dL (ref 0.0–40.0)

## 2019-02-05 LAB — CBC
HEMATOCRIT: 42.4 % (ref 39.0–52.0)
Hemoglobin: 14.4 g/dL (ref 13.0–17.0)
MCHC: 34 g/dL (ref 30.0–36.0)
MCV: 90.6 fl (ref 78.0–100.0)
PLATELETS: 221 10*3/uL (ref 150.0–400.0)
RBC: 4.68 Mil/uL (ref 4.22–5.81)
RDW: 13.5 % (ref 11.5–15.5)
WBC: 4 10*3/uL (ref 4.0–10.5)

## 2019-02-05 LAB — HEMOGLOBIN A1C: Hgb A1c MFr Bld: 5.5 % (ref 4.6–6.5)

## 2019-02-12 DIAGNOSIS — H2513 Age-related nuclear cataract, bilateral: Secondary | ICD-10-CM | POA: Diagnosis not present

## 2019-04-07 DIAGNOSIS — L821 Other seborrheic keratosis: Secondary | ICD-10-CM | POA: Diagnosis not present

## 2019-04-07 DIAGNOSIS — Z85828 Personal history of other malignant neoplasm of skin: Secondary | ICD-10-CM | POA: Diagnosis not present

## 2019-04-07 DIAGNOSIS — L570 Actinic keratosis: Secondary | ICD-10-CM | POA: Diagnosis not present

## 2019-06-16 ENCOUNTER — Telehealth: Payer: Self-pay | Admitting: Family Medicine

## 2019-06-16 DIAGNOSIS — E785 Hyperlipidemia, unspecified: Secondary | ICD-10-CM

## 2019-06-16 NOTE — Telephone Encounter (Signed)
Pt called to schedule 6 month f/u 08/06/2019. He would like to have labs prior to appt if possible. Please advise.

## 2019-06-18 NOTE — Telephone Encounter (Signed)
Could you place lab orders and I will call patient to schedule patient last labs were cmp, cbc, a1c

## 2019-06-18 NOTE — Telephone Encounter (Signed)
Ordered, thanks [employer]

## 2019-06-20 NOTE — Telephone Encounter (Signed)
Appointment has been scheduled for previsit labs.

## 2019-08-01 ENCOUNTER — Other Ambulatory Visit: Payer: Self-pay

## 2019-08-01 ENCOUNTER — Other Ambulatory Visit (INDEPENDENT_AMBULATORY_CARE_PROVIDER_SITE_OTHER): Payer: PPO

## 2019-08-01 DIAGNOSIS — E785 Hyperlipidemia, unspecified: Secondary | ICD-10-CM

## 2019-08-01 LAB — COMPREHENSIVE METABOLIC PANEL
ALT: 21 U/L (ref 0–53)
AST: 23 U/L (ref 0–37)
Albumin: 4.8 g/dL (ref 3.5–5.2)
Alkaline Phosphatase: 82 U/L (ref 39–117)
BUN: 18 mg/dL (ref 6–23)
CO2: 30 mEq/L (ref 19–32)
Calcium: 9.9 mg/dL (ref 8.4–10.5)
Chloride: 100 mEq/L (ref 96–112)
Creatinine, Ser: 1.02 mg/dL (ref 0.40–1.50)
GFR: 70.78 mL/min (ref >60.00)
Glucose, Bld: 89 mg/dL (ref 70–99)
Potassium: 4.4 mEq/L (ref 3.5–5.1)
Sodium: 137 mEq/L (ref 135–145)
Total Bilirubin: 0.9 mg/dL (ref 0.2–1.2)
Total Protein: 7.2 g/dL (ref 6.0–8.3)

## 2019-08-01 LAB — LIPID PANEL
Cholesterol: 266 mg/dL — ABNORMAL HIGH (ref 0–200)
HDL: 64.3 mg/dL (ref >39.00)
LDL Cholesterol: 181 mg/dL — ABNORMAL HIGH (ref 0–99)
NonHDL: 201.41
Total CHOL/HDL Ratio: 4
Triglycerides: 101 mg/dL (ref 0.0–149.0)
VLDL: 20.2 mg/dL (ref 0.0–40.0)

## 2019-08-02 ENCOUNTER — Encounter: Payer: Self-pay | Admitting: Family Medicine

## 2019-08-02 NOTE — Progress Notes (Signed)
Received labs, message to pt He is coming in this week   Results for orders placed or performed in visit on 08/01/19  Lipid panel  Result Value Ref Range   Cholesterol 266 (H) 0 - 200 mg/dL   Triglycerides 101.0 0.0 - 149.0 mg/dL   HDL 64.30 >39.00 mg/dL   VLDL 20.2 0.0 - 40.0 mg/dL   LDL Cholesterol 181 (H) 0 - 99 mg/dL   Total CHOL/HDL Ratio 4    NonHDL 201.41   Comprehensive metabolic panel  Result Value Ref Range   Sodium 137 135 - 145 mEq/L   Potassium 4.4 3.5 - 5.1 mEq/L   Chloride 100 96 - 112 mEq/L   CO2 30 19 - 32 mEq/L   Glucose, Bld 89 70 - 99 mg/dL   BUN 18 6 - 23 mg/dL   Creatinine, Ser 1.02 0.40 - 1.50 mg/dL   Total Bilirubin 0.9 0.2 - 1.2 mg/dL   Alkaline Phosphatase 82 39 - 117 U/L   AST 23 0 - 37 U/L   ALT 21 0 - 53 U/L   Total Protein 7.2 6.0 - 8.3 g/dL   Albumin 4.8 3.5 - 5.2 g/dL   Calcium 9.9 8.4 - 10.5 mg/dL   GFR 70.78 >60.00 mL/min

## 2019-08-04 NOTE — Patient Instructions (Addendum)
Great to see you again today- take care and have a wonderful fall Let's plan to visit for a physical in 6 months Please have your shingles series at your convenience at your pharmacy I do recommend a flu shot for all my patients!    Continue to take great care of yourself.  Let me know if any concerns come up in the meantime It would be my pleasure to see your wife as a patient.  However if she prefers to see someone closer to your home I would certainly recommend my former partner Horald Pollen- she is a top notch MD  Address: [address] [place], [place] [address]   Phone [phone] Phone [phone]

## 2019-08-04 NOTE — Progress Notes (Signed)
[place] at [facility] [address], [place], [place] [address] [phone]  Date:  08/06/2019   Name:  Parker Harrison   DOB:  1941-06-21   MRN:  [id number]  PCP:  Darreld Mclean, MD    Chief Complaint: Hyperlipidemia (6 month follow up) and Flu Vaccine (declined flu shot)   History of Present Illness:  Parker Harrison is a 78 y.o. very pleasant male patient who presents with the following:  Here today for periodic follow-up visit.  History of hyperlipidemia, prostate cancer, hearing loss Prostate cancer diagnosed 2014, had radioactive seed implant therapy I saw him most recently in February for physical He was in last week, and we drew labs as below  He does have some dyslipidemia, and is not currently on any medication for same  His urologist is Dahlstedt who checks his PSA and follows him for prostate cancer history - last visit December  The 10-year ASCVD risk score Mikey Bussing DC Brooke Bonito., et al., 2013) is: 27.9%   Values used to calculate the score:     Age: 87 years     Sex: Male     Is Non-Hispanic African American: No     Diabetic: No     Tobacco smoker: No     Systolic Blood Pressure: 0000000 mmHg     Is BP treated: No     HDL Cholesterol: 64.3 mg/dL     Total Cholesterol: 266 mg/dL   Results for orders placed or performed in visit on 08/01/19  Lipid panel  Result Value Ref Range   Cholesterol 266 (H) 0 - 200 mg/dL   Triglycerides 101.0 0.0 - 149.0 mg/dL   HDL 64.30 >39.00 mg/dL   VLDL 20.2 0.0 - 40.0 mg/dL   LDL Cholesterol 181 (H) 0 - 99 mg/dL   Total CHOL/HDL Ratio 4    NonHDL 201.41   Comprehensive metabolic panel  Result Value Ref Range   Sodium 137 135 - 145 mEq/L   Potassium 4.4 3.5 - 5.1 mEq/L   Chloride 100 96 - 112 mEq/L   CO2 30 19 - 32 mEq/L   Glucose, Bld 89 70 - 99 mg/dL   BUN 18 6 - 23 mg/dL   Creatinine, Ser 1.02 0.40 - 1.50 mg/dL   Total Bilirubin 0.9 0.2 - 1.2 mg/dL   Alkaline Phosphatase 82  39 - 117 U/L   AST 23 0 - 37 U/L   ALT 21 0 - 53 U/L   Total Protein 7.2 6.0 - 8.3 g/dL   Albumin 4.8 3.5 - 5.2 g/dL   Calcium 9.9 8.4 - 10.5 mg/dL   GFR 70.78 >60.00 mL/min   He is retired, has a daughter in [place] and a son [place].  He has 2 grandchildren.  He enjoys tennis and other exercise He is back to playing tennis three times a week Bridge games and the gym are on hold due to covid  His grandkids are 15 and 12- they are doing virtual school so far this fall.  They are doing ok so far with virtual learning   Shingrix- discussed with him, he plans to have this done at pharmacy  Flu- declines  Appears to be due for colonoscopy- however per pt he is postponed to 2022, he got a letter from his GI doc  We went over CV disease risk and I suggested a statin for his lipids.  He declines to use this at this  time which is certainly his right He will continue a healthy lifestyle and will try to work on his diet   No CP or SOB with vigorous exercise He has no other concerns today   Patient Active Problem List   Diagnosis Date Noted  . Hearing loss 05/25/2015  . Adenocarcinoma of prostate ([place]) 02/18/2013  . Hypercholesteremia 11/24/2012  . Personal history of colonic polyps 11/22/2012  . Quadriceps tendon rupture 08/16/2011  . Tear of meniscus, knee, medial 07/20/2011    Past Medical History:  Diagnosis Date  . Adenomatous polyp of colon   . Hearing aid worn    BILATERAL  . Hypercholesterolemia    NO MEDS  . Prostate cancer [facility])     Past Surgical History:  Procedure Laterality Date  . APPENDECTOMY    . COLONOSCOPY    . LEG WOUND REPAIR / CLOSURE Left 2000   INJURY  . PROSTATE BIOPSY    . PROSTATE SURGERY    . RADIOACTIVE SEED IMPLANT N/A 05/29/2013   Procedure: RADIOACTIVE SEED IMPLANT;  Surgeon: Franchot Gallo, MD;  Location: [facility];  Service: Urology;  Laterality: N/A;  . TONSILLECTOMY AND ADENOIDECTOMY  AGE 15  . VASECTOMY      Social  History   Tobacco Use  . Smoking status: Never Smoker  . Smokeless tobacco: Never Used  Substance Use Topics  . Alcohol use: Yes    Alcohol/week: 6.0 standard drinks    Types: 4 Glasses of wine, 2 Cans of beer per week    Comment: 5-6 drinks  . Drug use: No    Family History  Problem Relation Age of Onset  . Prostate cancer Father   . Prostate cancer Brother   . Cancer Brother   . Hyperlipidemia Brother   . Dementia Mother   . Cancer Brother   . Hyperlipidemia Brother   . Sudden death Neg Hx   . Hypertension Neg Hx   . Heart attack Neg Hx   . Diabetes Neg Hx     No Known Allergies  Medication list has been reviewed and updated.  Current Outpatient Medications on File Prior to Visit  Medication Sig Dispense Refill  . naproxen sodium (ANAPROX) 220 MG tablet Take 220 mg by mouth as needed.      No current facility-administered medications on file prior to visit.     Review of Systems:  As per HPI- otherwise negative. No fever or chills No urinary concerns   Physical Examination: Vitals:   08/06/19 0834  BP: 128/70  Pulse: 64  Resp: 16  Temp: (!) 97.5 F (36.4 C)  SpO2: 98%   Vitals:   08/06/19 0834  Weight: 169 lb (76.7 kg)  Height: 5\' 11"  (1.803 m)   Body mass index is 23.57 kg/m. Ideal Body Weight: Weight in (lb) to have BMI = 25: 178.9  GEN: WDWN, NAD, Non-toxic, A & O x 3, appears very fit and well for age  29: Atraumatic, Normocephalic. Neck supple. No masses, No LAD.  HOH, wears a hearing aid  Ears and Nose: No external deformity. CV: RRR, No M/G/R. No JVD. No thrill. No extra heart sounds. PULM: CTA B, no wheezes, crackles, rhonchi. No retractions. No resp. distress. No accessory muscle use. ABD: S, NT, ND. No rebound. No HSM. EXTR: No c/c/e NEURO Normal gait.  PSYCH: Normally interactive. Conversant. Not depressed or anxious appearing.  Calm demeanor.    Assessment and Plan:   ICD-10-CM   1. Dyslipidemia  E78.5   2. Screening for  colon cancer  Z12.11    Went over lipids today He does not wish to start on medication which is his right - will continue to follow a healthy lifestyle with plenty of exercise and a low fat diet.  Plan to recheck lipids in 6 months colonoscoyp is not due now after all Recommended shingrix and flu shot  Follow-up in 6 months- sooner if any concerns   Follow-up: No follow-ups on file.  No orders of the defined types were placed in this encounter.  No orders of the defined types were placed in this encounter.   @SIGN @    Signed Lamar Blinks, MD

## 2019-08-06 ENCOUNTER — Encounter: Payer: Self-pay | Admitting: Family Medicine

## 2019-08-06 ENCOUNTER — Ambulatory Visit (INDEPENDENT_AMBULATORY_CARE_PROVIDER_SITE_OTHER): Payer: PPO | Admitting: Family Medicine

## 2019-08-06 ENCOUNTER — Other Ambulatory Visit: Payer: Self-pay

## 2019-08-06 VITALS — BP 128/70 | HR 64 | Temp 97.5°F | Resp 16 | Ht 71.0 in | Wt 169.0 lb

## 2019-08-06 DIAGNOSIS — Z23 Encounter for immunization: Secondary | ICD-10-CM | POA: Diagnosis not present

## 2019-08-06 DIAGNOSIS — Z1211 Encounter for screening for malignant neoplasm of colon: Secondary | ICD-10-CM

## 2019-08-06 DIAGNOSIS — E785 Hyperlipidemia, unspecified: Secondary | ICD-10-CM

## 2019-10-07 DIAGNOSIS — B353 Tinea pedis: Secondary | ICD-10-CM | POA: Diagnosis not present

## 2019-10-07 DIAGNOSIS — L821 Other seborrheic keratosis: Secondary | ICD-10-CM | POA: Diagnosis not present

## 2019-10-07 DIAGNOSIS — D485 Neoplasm of uncertain behavior of skin: Secondary | ICD-10-CM | POA: Diagnosis not present

## 2019-10-07 DIAGNOSIS — L570 Actinic keratosis: Secondary | ICD-10-CM | POA: Diagnosis not present

## 2019-10-07 DIAGNOSIS — Z85828 Personal history of other malignant neoplasm of skin: Secondary | ICD-10-CM | POA: Diagnosis not present

## 2019-10-07 DIAGNOSIS — C4441 Basal cell carcinoma of skin of scalp and neck: Secondary | ICD-10-CM | POA: Diagnosis not present

## 2019-11-12 DIAGNOSIS — C4441 Basal cell carcinoma of skin of scalp and neck: Secondary | ICD-10-CM | POA: Diagnosis not present

## 2019-11-12 DIAGNOSIS — Z85828 Personal history of other malignant neoplasm of skin: Secondary | ICD-10-CM | POA: Diagnosis not present

## 2019-11-24 DIAGNOSIS — C61 Malignant neoplasm of prostate: Secondary | ICD-10-CM | POA: Diagnosis not present

## 2019-11-26 LAB — PSA: PSA: 0.029

## 2019-12-01 DIAGNOSIS — N5201 Erectile dysfunction due to arterial insufficiency: Secondary | ICD-10-CM | POA: Diagnosis not present

## 2019-12-01 DIAGNOSIS — C61 Malignant neoplasm of prostate: Secondary | ICD-10-CM | POA: Diagnosis not present

## 2019-12-24 ENCOUNTER — Ambulatory Visit: Payer: Medicare Other | Attending: Internal Medicine

## 2019-12-24 DIAGNOSIS — Z23 Encounter for immunization: Secondary | ICD-10-CM | POA: Insufficient documentation

## 2019-12-24 NOTE — Progress Notes (Signed)
   Covid-19 Vaccination Clinic  Name:  Parker Harrison    MRN: [id number] DOB: 03-08-1941  12/24/2019  [name] was observed post Covid-19 immunization for 15 minutes without incidence. He was provided with Vaccine Information Sheet and instruction to access the V-Safe system.   [name] was instructed to call 911 with any severe reactions post vaccine: Marland Kitchen Difficulty breathing  . Swelling of your face and throat  . A fast heartbeat  . A bad rash all over your body  . Dizziness and weakness    Immunizations Administered    Name Date Dose VIS Date Route   Pfizer COVID-19 Vaccine 12/24/2019 10:47 AM 0.3 mL 11/21/2019 Intramuscular   Manufacturer: [place]   Lot: [id number]   [place]: [id number]

## 2020-01-13 ENCOUNTER — Ambulatory Visit: Payer: PPO | Attending: Internal Medicine

## 2020-01-13 DIAGNOSIS — Z23 Encounter for immunization: Secondary | ICD-10-CM | POA: Insufficient documentation

## 2020-01-13 NOTE — Progress Notes (Signed)
   Covid-19 Vaccination Clinic  Name:  Parker Harrison    MRN: [id number] DOB: 03/02/1941  01/13/2020  Parker Harrison was observed post Covid-19 immunization for 15 minutes without incidence. He was provided with Vaccine Information Sheet and instruction to access the V-Safe system.   Parker Harrison was instructed to call 911 with any severe reactions post vaccine: Marland Kitchen Difficulty breathing  . Swelling of your face and throat  . A fast heartbeat  . A bad rash all over your body  . Dizziness and weakness    Immunizations Administered    Name Date Dose VIS Date Route   Pfizer COVID-19 Vaccine 01/13/2020  8:36 AM 0.3 mL 11/21/2019 Intramuscular   Manufacturer: [place]   Lot: [id number]   [place]: [id number]

## 2020-02-01 NOTE — Progress Notes (Addendum)
[place] at [facility] [address] [place], [place] [address] [phone]  Date:  02/04/2020   Name:  Parker Harrison   DOB:  11-Nov-1941   MRN:  [id number]  PCP:  Darreld Mclean, MD    Chief Complaint: Annual Exam   History of Present Illness:  Parker Harrison is a 79 y.o. very pleasant male patient who presents with the following:  Here today for 54-month follow-up visit History of hyperlipidemia, prostate cancer, colon polyps, hearing loss Last seen by myself in August  His urologist is Dr. Lupita Shutter seen in December, PSA looked good Prostate cancer was diagnosed in 2014, he had radioactive seed implant treatment He was told by Dr D that ok for me to take over PSA surveillance at this time  Dr. Martinique is his dermatologist  He is retired with 2 adult children and 2 teenage grandchildren.  He enjoys playing tennis and has been playing a good bit  Per patient his colon cancer screening is due in 2022-he received a letter from gastroenterology He has previously declined statin therapy  CMP, lipids done in August Can do CBC, A1c today if he likes Suggest Shingrix-he is thinking about this He has had Covid series- he tolerated well  He cut his right thumb with a saw about 2 weeks ago while cutting up some down trees tdap is UTD He just wanted to make sure his thumb is ok before he plays tennis today  He also notes poor ROM in his neck This has bothered him for a year or more No injury noted, his neck does not generally hurt Bothers him when playing tennis or driving, he cannot rotate his neck as well as he would like Patient Active Problem List   Diagnosis Date Noted  . Hearing loss 05/25/2015  . Adenocarcinoma of prostate ([place]) 02/18/2013  . Hypercholesteremia 11/24/2012  . Personal history of colonic polyps 11/22/2012  . Quadriceps tendon rupture 08/16/2011  . Tear of meniscus, knee, medial 07/20/2011    Past  Medical History:  Diagnosis Date  . Adenomatous polyp of colon   . Hearing aid worn    BILATERAL  . Hypercholesterolemia    NO MEDS  . Prostate cancer [facility])     Past Surgical History:  Procedure Laterality Date  . APPENDECTOMY    . COLONOSCOPY    . LEG WOUND REPAIR / CLOSURE Left 2000   INJURY  . PROSTATE BIOPSY    . PROSTATE SURGERY    . RADIOACTIVE SEED IMPLANT N/A 05/29/2013   Procedure: RADIOACTIVE SEED IMPLANT;  Surgeon: Franchot Gallo, MD;  Location[facility];  Service: Urology;  Laterality: N/A;  . TONSILLECTOMY AND ADENOIDECTOMY  AGE 46  . VASECTOMY      Social History   Tobacco Use  . Smoking status: Never Smoker  . Smokeless tobacco: Never Used  Substance Use Topics  . Alcohol use: Yes    Alcohol/week: 6.0 standard drinks    Types: 4 Glasses of wine, 2 Cans of beer per week    Comment: 5-6 drinks  . Drug use: No    Family History  Problem Relation Age of Onset  . Prostate cancer Father   . Prostate cancer Brother   . Cancer Brother   . Hyperlipidemia Brother   . Dementia Mother   . Cancer Brother   . Hyperlipidemia Brother   . Sudden death Neg Hx   . Hypertension  Neg Hx   . Heart attack Neg Hx   . Diabetes Neg Hx     No Known Allergies  Medication list has been reviewed and updated.  Current Outpatient Medications on File Prior to Visit  Medication Sig Dispense Refill  . naproxen sodium (ANAPROX) 220 MG tablet Take 220 mg by mouth as needed.      No current facility-administered medications on file prior to visit.    Review of Systems:  As per HPI- otherwise negative. No fever or chills, no chest pain or shortness of breath  Physical Examination: Vitals:   02/04/20 0815  BP: 132/72  Pulse: 62  Resp: 16  Temp: (!) 97 F (36.1 C)  SpO2: 98%   Vitals:   02/04/20 0815  Weight: 177 lb (80.3 kg)  Height: 5\' 11"  (1.803 m)   Body mass index is 24.69 kg/m. Ideal Body Weight: Weight in (lb) to have BMI = 25:  178.9  GEN: no acute distress.  Normal weight, looks fit and athletic for age HEENT: Atraumatic, Normocephalic.  No cervical spine tenderness.  Normal flexion extension.  Rotation of the neck is decreased to about 30 degrees in either direction Ears and Nose: No external deformity. CV: RRR, No M/G/R. No JVD. No thrill. No extra heart sounds. PULM: CTA B, no wheezes, crackles, rhonchi. No retractions. No resp. distress. No accessory muscle use. ABD: S, NT, ND, +BS. No rebound. No HSM. EXTR: No c/c/e PSYCH: Normally interactive. Conversant.  He has a bruise at the base of his right thumbnail.  Otherwise range of motion of thumb and hand is normal, he has no particular tenderness.   Assessment and Plan: Dyslipidemia  Screening for diabetes mellitus - Plan: Hemoglobin A1c  Screening for deficiency anemia - Plan: CBC  Contusion of right thumb without damage to nail, initial encounter  Neck stiffness  Patient has so far declined statin therapy, will plan to recheck his lipids at next visit A1c pending today CBC pending today Examined his thumb, I doubt there is a fracture.  Offered to do an x-ray but he declines today.  He will try playing tennis, will let me know if he has pain His wife sees a Restaurant manager, fast food in town, he wonders if this would be a good person to consult regarding his neck.  I think this would be a fine idea, a chiropractor can take some neck films and advised him on any stretching that would be helpful  Plan to visit in 6 months, sooner if any concerns Moderate medical decision making today This visit occurred during the SARS-CoV-2 public health emergency.  Safety protocols were in place, including screening questions prior to the visit, additional usage of staff PPE, and extensive cleaning of exam room while observing appropriate contact time as indicated for disinfecting solutions.    Signed Lamar Blinks, MD  Addendum 2/25, received his labs as below A1c has gone  up a bit  Results for orders placed or performed in visit on 02/04/20  CBC  Result Value Ref Range   WBC 4.2 4.0 - 10.5 K/uL   RBC 4.66 4.22 - 5.81 Mil/uL   Platelets 217.0 150.0 - 400.0 K/uL   Hemoglobin 14.0 13.0 - 17.0 g/dL   HCT 42.6 39.0 - 52.0 %   MCV 91.4 78.0 - 100.0 fl   MCHC 32.8 30.0 - 36.0 g/dL   RDW 14.1 11.5 - 15.5 %  Hemoglobin A1c  Result Value Ref Range   Hgb A1c MFr Bld 6.0 4.6 -  6.5 %     

## 2020-02-01 NOTE — Patient Instructions (Addendum)
Great to see you again today, take care.  I would suggest the shingles vaccine at your convenience- given at your drug store I think your thumb looks ok, but please let me know if you have pain when you play later today  I think seeing a chrio for your neck would be a good idea  Please see me in about 6 months for your physical- we can do a PSA at that time if you like

## 2020-02-04 ENCOUNTER — Encounter: Payer: Self-pay | Admitting: Family Medicine

## 2020-02-04 ENCOUNTER — Other Ambulatory Visit: Payer: Self-pay

## 2020-02-04 ENCOUNTER — Ambulatory Visit (INDEPENDENT_AMBULATORY_CARE_PROVIDER_SITE_OTHER): Payer: PPO | Admitting: Family Medicine

## 2020-02-04 VITALS — BP 132/72 | HR 62 | Temp 97.0°F | Resp 16 | Ht 71.0 in | Wt 177.0 lb

## 2020-02-04 DIAGNOSIS — Z130 Encounter for screening for diseases of the blood and blood-forming organs and certain disorders involving the immune mechanism: Secondary | ICD-10-CM

## 2020-02-04 DIAGNOSIS — S60011A Contusion of right thumb without damage to nail, initial encounter: Secondary | ICD-10-CM | POA: Diagnosis not present

## 2020-02-04 DIAGNOSIS — M436 Torticollis: Secondary | ICD-10-CM | POA: Diagnosis not present

## 2020-02-04 DIAGNOSIS — E785 Hyperlipidemia, unspecified: Secondary | ICD-10-CM | POA: Diagnosis not present

## 2020-02-04 DIAGNOSIS — Z131 Encounter for screening for diabetes mellitus: Secondary | ICD-10-CM

## 2020-02-04 LAB — CBC
HCT: 42.6 % (ref 39.0–52.0)
Hemoglobin: 14 g/dL (ref 13.0–17.0)
MCHC: 32.8 g/dL (ref 30.0–36.0)
MCV: 91.4 fl (ref 78.0–100.0)
Platelets: 217 10*3/uL (ref 150.0–400.0)
RBC: 4.66 Mil/uL (ref 4.22–5.81)
RDW: 14.1 % (ref 11.5–15.5)
WBC: 4.2 10*3/uL (ref 4.0–10.5)

## 2020-02-05 ENCOUNTER — Encounter: Payer: Self-pay | Admitting: Family Medicine

## 2020-02-05 LAB — HEMOGLOBIN A1C: Hgb A1c MFr Bld: 6 % (ref 4.6–6.5)

## 2020-02-09 DIAGNOSIS — M9904 Segmental and somatic dysfunction of sacral region: Secondary | ICD-10-CM | POA: Diagnosis not present

## 2020-02-09 DIAGNOSIS — M9901 Segmental and somatic dysfunction of cervical region: Secondary | ICD-10-CM | POA: Diagnosis not present

## 2020-02-09 DIAGNOSIS — M5030 Other cervical disc degeneration, unspecified cervical region: Secondary | ICD-10-CM | POA: Diagnosis not present

## 2020-02-09 DIAGNOSIS — M5136 Other intervertebral disc degeneration, lumbar region: Secondary | ICD-10-CM | POA: Diagnosis not present

## 2020-02-09 DIAGNOSIS — M9903 Segmental and somatic dysfunction of lumbar region: Secondary | ICD-10-CM | POA: Diagnosis not present

## 2020-02-09 DIAGNOSIS — M9902 Segmental and somatic dysfunction of thoracic region: Secondary | ICD-10-CM | POA: Diagnosis not present

## 2020-02-11 DIAGNOSIS — M9902 Segmental and somatic dysfunction of thoracic region: Secondary | ICD-10-CM | POA: Diagnosis not present

## 2020-02-11 DIAGNOSIS — M5030 Other cervical disc degeneration, unspecified cervical region: Secondary | ICD-10-CM | POA: Diagnosis not present

## 2020-02-11 DIAGNOSIS — M9901 Segmental and somatic dysfunction of cervical region: Secondary | ICD-10-CM | POA: Diagnosis not present

## 2020-02-11 DIAGNOSIS — M9904 Segmental and somatic dysfunction of sacral region: Secondary | ICD-10-CM | POA: Diagnosis not present

## 2020-02-11 DIAGNOSIS — M5136 Other intervertebral disc degeneration, lumbar region: Secondary | ICD-10-CM | POA: Diagnosis not present

## 2020-02-11 DIAGNOSIS — M9903 Segmental and somatic dysfunction of lumbar region: Secondary | ICD-10-CM | POA: Diagnosis not present

## 2020-02-16 DIAGNOSIS — M5030 Other cervical disc degeneration, unspecified cervical region: Secondary | ICD-10-CM | POA: Diagnosis not present

## 2020-02-16 DIAGNOSIS — M9904 Segmental and somatic dysfunction of sacral region: Secondary | ICD-10-CM | POA: Diagnosis not present

## 2020-02-16 DIAGNOSIS — M9903 Segmental and somatic dysfunction of lumbar region: Secondary | ICD-10-CM | POA: Diagnosis not present

## 2020-02-16 DIAGNOSIS — M5136 Other intervertebral disc degeneration, lumbar region: Secondary | ICD-10-CM | POA: Diagnosis not present

## 2020-02-16 DIAGNOSIS — M9901 Segmental and somatic dysfunction of cervical region: Secondary | ICD-10-CM | POA: Diagnosis not present

## 2020-02-16 DIAGNOSIS — M9902 Segmental and somatic dysfunction of thoracic region: Secondary | ICD-10-CM | POA: Diagnosis not present

## 2020-02-18 DIAGNOSIS — M9904 Segmental and somatic dysfunction of sacral region: Secondary | ICD-10-CM | POA: Diagnosis not present

## 2020-02-18 DIAGNOSIS — M5030 Other cervical disc degeneration, unspecified cervical region: Secondary | ICD-10-CM | POA: Diagnosis not present

## 2020-02-18 DIAGNOSIS — M9903 Segmental and somatic dysfunction of lumbar region: Secondary | ICD-10-CM | POA: Diagnosis not present

## 2020-02-18 DIAGNOSIS — M9902 Segmental and somatic dysfunction of thoracic region: Secondary | ICD-10-CM | POA: Diagnosis not present

## 2020-02-18 DIAGNOSIS — M5136 Other intervertebral disc degeneration, lumbar region: Secondary | ICD-10-CM | POA: Diagnosis not present

## 2020-02-18 DIAGNOSIS — M9901 Segmental and somatic dysfunction of cervical region: Secondary | ICD-10-CM | POA: Diagnosis not present

## 2020-02-23 DIAGNOSIS — M5030 Other cervical disc degeneration, unspecified cervical region: Secondary | ICD-10-CM | POA: Diagnosis not present

## 2020-02-23 DIAGNOSIS — M9903 Segmental and somatic dysfunction of lumbar region: Secondary | ICD-10-CM | POA: Diagnosis not present

## 2020-02-23 DIAGNOSIS — M9901 Segmental and somatic dysfunction of cervical region: Secondary | ICD-10-CM | POA: Diagnosis not present

## 2020-02-23 DIAGNOSIS — M5136 Other intervertebral disc degeneration, lumbar region: Secondary | ICD-10-CM | POA: Diagnosis not present

## 2020-02-23 DIAGNOSIS — M9904 Segmental and somatic dysfunction of sacral region: Secondary | ICD-10-CM | POA: Diagnosis not present

## 2020-02-23 DIAGNOSIS — M9902 Segmental and somatic dysfunction of thoracic region: Secondary | ICD-10-CM | POA: Diagnosis not present

## 2020-02-25 DIAGNOSIS — M9901 Segmental and somatic dysfunction of cervical region: Secondary | ICD-10-CM | POA: Diagnosis not present

## 2020-02-25 DIAGNOSIS — M9904 Segmental and somatic dysfunction of sacral region: Secondary | ICD-10-CM | POA: Diagnosis not present

## 2020-02-25 DIAGNOSIS — M9902 Segmental and somatic dysfunction of thoracic region: Secondary | ICD-10-CM | POA: Diagnosis not present

## 2020-02-25 DIAGNOSIS — M5030 Other cervical disc degeneration, unspecified cervical region: Secondary | ICD-10-CM | POA: Diagnosis not present

## 2020-02-25 DIAGNOSIS — M9903 Segmental and somatic dysfunction of lumbar region: Secondary | ICD-10-CM | POA: Diagnosis not present

## 2020-02-25 DIAGNOSIS — M5136 Other intervertebral disc degeneration, lumbar region: Secondary | ICD-10-CM | POA: Diagnosis not present

## 2020-05-07 ENCOUNTER — Encounter: Payer: Self-pay | Admitting: Family Medicine

## 2020-05-07 DIAGNOSIS — D692 Other nonthrombocytopenic purpura: Secondary | ICD-10-CM | POA: Diagnosis not present

## 2020-05-07 DIAGNOSIS — C44719 Basal cell carcinoma of skin of left lower limb, including hip: Secondary | ICD-10-CM | POA: Diagnosis not present

## 2020-05-07 DIAGNOSIS — Z85828 Personal history of other malignant neoplasm of skin: Secondary | ICD-10-CM | POA: Diagnosis not present

## 2020-05-07 DIAGNOSIS — L570 Actinic keratosis: Secondary | ICD-10-CM | POA: Diagnosis not present

## 2020-05-07 DIAGNOSIS — D485 Neoplasm of uncertain behavior of skin: Secondary | ICD-10-CM | POA: Diagnosis not present

## 2020-05-07 DIAGNOSIS — D2261 Melanocytic nevi of right upper limb, including shoulder: Secondary | ICD-10-CM | POA: Diagnosis not present

## 2020-05-07 DIAGNOSIS — L821 Other seborrheic keratosis: Secondary | ICD-10-CM | POA: Diagnosis not present

## 2020-05-07 DIAGNOSIS — L820 Inflamed seborrheic keratosis: Secondary | ICD-10-CM | POA: Diagnosis not present

## 2020-06-30 ENCOUNTER — Telehealth: Payer: Self-pay | Admitting: Family Medicine

## 2020-06-30 NOTE — Telephone Encounter (Signed)
Received a note from pt that he needs an order for a hearing eval sent to [facility] with example order form.  Completed order for pt and will mail to [facility] in the envelope provided by pt

## 2020-08-01 DIAGNOSIS — R7303 Prediabetes: Secondary | ICD-10-CM | POA: Insufficient documentation

## 2020-08-01 NOTE — Patient Instructions (Addendum)
Great to see you again today as always Remember colon due next year You can get your 2nd dose of shingrix at your pharmacy at your convenience Plan for covid booster in October  I will be in touch with labs Visit 6 months

## 2020-08-01 NOTE — Progress Notes (Addendum)
[facility] [place], [place], [place] [address] [phone]  Date:  08/04/2020   Name:  [facility]   DOB:  January 17, 1941   MRN:  [id number]  PCP:  Darreld Mclean, MD    Chief Complaint: Annual Exam   History of Present Illness:  [facility] is a 79 y.o. very pleasant male patient who presents with the following:  6 month follow-up visit today Pt with history of hyperlipidemia, prostate cancer, quad rupture, hearing loss, last A1c 6%   Never a smoker, light alcohol Last seen by myself in February  His urologist is Dr. Lupita Shutter seen in December, PSA looked good- has gone to 0 per pt report  Prostate cancer was diagnosed in 2014, he had radioactive seed implant treatment He was told by Dr D that ok for me to take over PSA surveillance at this time -patient would like me to check PSA today Dr. Martinique is his dermatologist- recent visit in her office  He is retired with 2 adult children and 2 teenage grandchildren- 63 and 69 yo.  He enjoys playing tennis and has been playing a good bit - about 3x a week  Per patient his colon cancer screening is due in 2022-he received a letter from gastroenterology He has previously declined statin therapy  Did a CBC and A1c in February Lipids a year ago - pt has not eaten  Colon due next year covid series done- we discussed booster  shingrix  He is supposed to attend a football game next week- outdoors.  He wonders about the safety of this plan.  His wife is not vaccinated yet- she is going to start this weekend.  After discussion he thinks he will stay home from this football game  He also notes a tender spot on the left side of his nose from his glasses Would like to check ears prior to upcoming hearing test   Patient Active Problem List   Diagnosis Date Noted  . Pre-diabetes 08/01/2020  . Hearing loss 05/25/2015  . Adenocarcinoma of prostate ([place]) 02/18/2013   . Hypercholesteremia 11/24/2012  . Personal history of colonic polyps 11/22/2012  . Quadriceps tendon rupture 08/16/2011  . Tear of meniscus, knee, medial 07/20/2011    Past Medical History:  Diagnosis Date  . Adenomatous polyp of colon   . Hearing aid worn    BILATERAL  . Hypercholesterolemia    NO MEDS  . Prostate cancer [facility])     Past Surgical History:  Procedure Laterality Date  . APPENDECTOMY    . COLONOSCOPY    . LEG WOUND REPAIR / CLOSURE Left 2000   INJURY  . PROSTATE BIOPSY    . PROSTATE SURGERY    . RADIOACTIVE SEED IMPLANT N/A 05/29/2013   Procedure: RADIOACTIVE SEED IMPLANT;  Surgeon: Franchot Gallo, MD;  Location: [facility];  Service: Urology;  Laterality: N/A;  . TONSILLECTOMY AND ADENOIDECTOMY  AGE 69  . VASECTOMY      Social History   Tobacco Use  . Smoking status: Never Smoker  . Smokeless tobacco: Never Used  Substance Use Topics  . Alcohol use: Yes    Alcohol/week: 6.0 standard drinks    Types: 4 Glasses of wine, 2 Cans of beer per week    Comment: 5-6 drinks  . Drug use: No    Family History  Problem Relation Age of Onset  . Prostate cancer Father   .  Prostate cancer Brother   . Cancer Brother   . Hyperlipidemia Brother   . Dementia Mother   . Cancer Brother   . Hyperlipidemia Brother   . Sudden death Neg Hx   . Hypertension Neg Hx   . Heart attack Neg Hx   . Diabetes Neg Hx     No Known Allergies  Medication list has been reviewed and updated.  Current Outpatient Medications on File Prior to Visit  Medication Sig Dispense Refill  . naproxen sodium (ANAPROX) 220 MG tablet Take 220 mg by mouth as needed.     Marland Kitchen SHINGRIX injection      No current facility-administered medications on file prior to visit.    Review of Systems:  As per HPI- otherwise negative.   Physical Examination: Vitals:   08/04/20 0821  BP: 128/70  Pulse: 62  Temp: 97.7 F (36.5 C)  SpO2: 98%   Vitals:   08/04/20 0821  Weight:  176 lb (79.8 kg)  Height: 5\' 11"  (1.803 m)   Body mass index is 24.55 kg/m. Ideal Body Weight: Weight in (lb) to have BMI = 25: 178.9  GEN: no acute distress.  Tall build, normal weight.  Looks well HEENT: Atraumatic, Normocephalic. Bilateral TM wnl, oropharynx normal.  PEERL,EOMI.   Ears and Nose: No external deformity. CV: RRR, No M/G/R. No JVD. No thrill. No extra heart sounds. PULM: CTA B, no wheezes, crackles, rhonchi. No retractions. No resp. distress. No accessory muscle use. ABD: S, NT, ND, +BS. No rebound. No HSM. EXTR: No c/c/e PSYCH: Normally interactive. Conversant.  Removed wax from right ear with curette  There is a small sore spot on the left side of his nose where his glasses have rubbed.  We discussed ways to pad and protect this area, he plans to see his eye doctor soon to have his glasses potentially adjusted DRE today, normal, no hemorrhoids No evidence of inguinal hernia   Assessment and Plan: Dyslipidemia - Plan: Lipid panel  Screening for diabetes mellitus  Screening for deficiency anemia - Plan: CBC  Encounter for hepatitis C screening test for low risk patient - Plan: Hepatitis C antibody  Pre-diabetes - Plan: Comprehensive metabolic panel, Hemoglobin A1c  History of prostate cancer - Plan: PSA, Medicare  Patient today for follow-up visit Labs are pending as above Discussed booster doses of his various vaccines Will plan further follow- up pending labs. Encouraged continued healthy lifestyle and exercise This visit occurred during the SARS-CoV-2 public health emergency.  Safety protocols were in place, including screening questions prior to the visit, additional usage of staff PPE, and extensive cleaning of exam room while observing appropriate contact time as indicated for disinfecting solutions.    Signed Lamar Blinks, MD  Addendum 8/26-message to patient regarding labs  Results for orders placed or performed in visit on 08/04/20  CBC   Result Value Ref Range   WBC 4.4 3.8 - 10.8 Thousand/uL   RBC 4.66 4.20 - 5.80 Million/uL   Hemoglobin 14.3 13.2 - 17.1 g/dL   HCT 42.8 38 - 50 %   MCV 91.8 80.0 - 100.0 fL   MCH 30.7 27.0 - 33.0 pg   MCHC 33.4 32.0 - 36.0 g/dL   RDW 13.5 11.0 - 15.0 %   Platelets 220 140 - 400 Thousand/uL   MPV 10.1 7.5 - 12.5 fL  Comprehensive metabolic panel  Result Value Ref Range   Glucose, Bld 94 65 - 99 mg/dL   BUN 20 7 - 25  mg/dL   Creat 1.09 0.70 - 1.18 mg/dL   BUN/Creatinine Ratio NOT APPLICABLE 6 - 22 (calc)   Sodium 137 135 - 146 mmol/L   Potassium 4.6 3.5 - 5.3 mmol/L   Chloride 101 98 - 110 mmol/L   CO2 26 20 - 32 mmol/L   Calcium 9.4 8.6 - 10.3 mg/dL   Total Protein 6.9 6.1 - 8.1 g/dL   Albumin 4.4 3.6 - 5.1 g/dL   Globulin 2.5 1.9 - 3.7 g/dL (calc)   AG Ratio 1.8 1.0 - 2.5 (calc)   Total Bilirubin 0.9 0.2 - 1.2 mg/dL   Alkaline phosphatase (APISO) 75 35 - 144 U/L   AST 24 10 - 35 U/L   ALT 22 9 - 46 U/L  Hemoglobin A1c  Result Value Ref Range   Hgb A1c MFr Bld 5.4 <5.7 % of total Hgb   Mean Plasma Glucose 108 (calc)   eAG (mmol/L) 6.0 (calc)  Lipid panel  Result Value Ref Range   Cholesterol 243 (H) <200 mg/dL   HDL 64 > OR = 40 mg/dL   Triglycerides 78 <150 mg/dL   LDL Cholesterol (Calc) 161 (H) mg/dL (calc)   Total CHOL/HDL Ratio 3.8 <5.0 (calc)   Non-HDL Cholesterol (Calc) 179 (H) <130 mg/dL (calc)

## 2020-08-04 ENCOUNTER — Encounter: Payer: Self-pay | Admitting: Family Medicine

## 2020-08-04 ENCOUNTER — Other Ambulatory Visit: Payer: Self-pay

## 2020-08-04 ENCOUNTER — Ambulatory Visit (INDEPENDENT_AMBULATORY_CARE_PROVIDER_SITE_OTHER): Payer: PPO | Admitting: Family Medicine

## 2020-08-04 VITALS — BP 128/70 | HR 62 | Temp 97.7°F | Ht 71.0 in | Wt 176.0 lb

## 2020-08-04 DIAGNOSIS — Z130 Encounter for screening for diseases of the blood and blood-forming organs and certain disorders involving the immune mechanism: Secondary | ICD-10-CM | POA: Diagnosis not present

## 2020-08-04 DIAGNOSIS — R7303 Prediabetes: Secondary | ICD-10-CM | POA: Diagnosis not present

## 2020-08-04 DIAGNOSIS — Z131 Encounter for screening for diabetes mellitus: Secondary | ICD-10-CM

## 2020-08-04 DIAGNOSIS — Z1159 Encounter for screening for other viral diseases: Secondary | ICD-10-CM | POA: Diagnosis not present

## 2020-08-04 DIAGNOSIS — E785 Hyperlipidemia, unspecified: Secondary | ICD-10-CM

## 2020-08-04 DIAGNOSIS — Z8546 Personal history of malignant neoplasm of prostate: Secondary | ICD-10-CM

## 2020-08-05 ENCOUNTER — Encounter: Payer: Self-pay | Admitting: Family Medicine

## 2020-08-06 ENCOUNTER — Telehealth: Payer: Self-pay | Admitting: Family Medicine

## 2020-08-06 NOTE — Telephone Encounter (Signed)
Per [place],  Patient lab was release to [place] states all lab were sent to [facility] and she believe patient's PSA shuhld have been sent there as well.  Please Advise

## 2020-08-09 NOTE — Addendum Note (Signed)
Addended by: Kelle Darting A on: 08/09/2020 09:17 AM   Modules accepted: Orders

## 2020-08-09 NOTE — Telephone Encounter (Signed)
Spoke with Stanton Kidney at [facility] and test has been added.

## 2020-08-10 ENCOUNTER — Encounter: Payer: Self-pay | Admitting: Family Medicine

## 2020-08-10 LAB — COMPREHENSIVE METABOLIC PANEL
AG Ratio: 1.8 (calc) (ref 1.0–2.5)
ALT: 22 U/L (ref 9–46)
AST: 24 U/L (ref 10–35)
Albumin: 4.4 g/dL (ref 3.6–5.1)
Alkaline phosphatase (APISO): 75 U/L (ref 35–144)
BUN: 20 mg/dL (ref 7–25)
CO2: 26 mmol/L (ref 20–32)
Calcium: 9.4 mg/dL (ref 8.6–10.3)
Chloride: 101 mmol/L (ref 98–110)
Creat: 1.09 mg/dL (ref 0.70–1.18)
Globulin: 2.5 g/dL (calc) (ref 1.9–3.7)
Glucose, Bld: 94 mg/dL (ref 65–99)
Potassium: 4.6 mmol/L (ref 3.5–5.3)
Sodium: 137 mmol/L (ref 135–146)
Total Bilirubin: 0.9 mg/dL (ref 0.2–1.2)
Total Protein: 6.9 g/dL (ref 6.1–8.1)

## 2020-08-10 LAB — PSA: PSA: <0.1 ng/mL (ref <=4.0)

## 2020-08-10 LAB — TEST AUTHORIZATION

## 2020-08-10 LAB — CBC
HCT: 42.8 % (ref 38.5–50.0)
Hemoglobin: 14.3 g/dL (ref 13.2–17.1)
MCH: 30.7 pg (ref 27.0–33.0)
MCHC: 33.4 g/dL (ref 32.0–36.0)
MCV: 91.8 fL (ref 80.0–100.0)
MPV: 10.1 fL (ref 7.5–12.5)
Platelets: 220 10*3/uL (ref 140–400)
RBC: 4.66 10*6/uL (ref 4.20–5.80)
RDW: 13.5 % (ref 11.0–15.0)
WBC: 4.4 10*3/uL (ref 3.8–10.8)

## 2020-08-10 LAB — LIPID PANEL
Cholesterol: 243 mg/dL — ABNORMAL HIGH (ref <200)
HDL: 64 mg/dL (ref >=40)
LDL Cholesterol (Calc): 161 mg/dL (calc) — ABNORMAL HIGH
Non-HDL Cholesterol (Calc): 179 mg/dL (calc) — ABNORMAL HIGH (ref <130)
Total CHOL/HDL Ratio: 3.8 (calc) (ref <5.0)
Triglycerides: 78 mg/dL (ref <150)

## 2020-08-10 LAB — HEMOGLOBIN A1C
Hgb A1c MFr Bld: 5.4 % of total Hgb (ref <5.7)
Mean Plasma Glucose: 108 (calc)
eAG (mmol/L): 6 (calc)

## 2020-08-10 LAB — HEPATITIS C ANTIBODY
Hepatitis C Ab: NONREACTIVE
SIGNAL TO CUT-OFF: 0.02 (ref <1.00)

## 2020-08-11 DIAGNOSIS — H903 Sensorineural hearing loss, bilateral: Secondary | ICD-10-CM | POA: Diagnosis not present

## 2020-10-20 DIAGNOSIS — H43813 Vitreous degeneration, bilateral: Secondary | ICD-10-CM | POA: Diagnosis not present

## 2020-11-09 DIAGNOSIS — Z85828 Personal history of other malignant neoplasm of skin: Secondary | ICD-10-CM | POA: Diagnosis not present

## 2020-11-09 DIAGNOSIS — L249 Irritant contact dermatitis, unspecified cause: Secondary | ICD-10-CM | POA: Diagnosis not present

## 2020-11-09 DIAGNOSIS — L853 Xerosis cutis: Secondary | ICD-10-CM | POA: Diagnosis not present

## 2020-11-09 DIAGNOSIS — L821 Other seborrheic keratosis: Secondary | ICD-10-CM | POA: Diagnosis not present

## 2020-11-09 DIAGNOSIS — L570 Actinic keratosis: Secondary | ICD-10-CM | POA: Diagnosis not present

## 2020-12-19 ENCOUNTER — Encounter: Payer: Self-pay | Admitting: Family Medicine

## 2020-12-20 ENCOUNTER — Ambulatory Visit (INDEPENDENT_AMBULATORY_CARE_PROVIDER_SITE_OTHER): Payer: PPO | Admitting: Medical

## 2020-12-20 VITALS — BP 110/64 | HR 63 | Temp 97.7°F

## 2020-12-20 DIAGNOSIS — R918 Other nonspecific abnormal finding of lung field: Secondary | ICD-10-CM

## 2020-12-20 DIAGNOSIS — J029 Acute pharyngitis, unspecified: Secondary | ICD-10-CM

## 2020-12-20 DIAGNOSIS — R059 Cough, unspecified: Secondary | ICD-10-CM

## 2020-12-20 MED ORDER — BENZONATATE 100 MG PO CAPS: 100.0000 mg | ORAL_CAPSULE | Freq: Three times a day (TID) | ORAL | 0 refills | Status: DC

## 2020-12-20 NOTE — Telephone Encounter (Signed)
Patient will need to schedule an appointment with covid clinic. I have gave him information to call them. I tried calling but it seems like they need patient to leave their name, dob and symptoms.

## 2020-12-20 NOTE — Progress Notes (Signed)
Subjective:  [facility] is a 80 y.o. male who presents for Chief Complaint  Patient presents with  . covid    Cough pt did at home covid test negative, cough x2 weeks      He reports bad cough x 2+ weeks.   A week ago very bad, but last 2-3 days has improved.  Dry cough now.   Initially sore throat, cough.  No headache, no body aches, no chills, no fever, never felt feverish.  No NVD.  No SOB, no wheeze.   But any tickle in throat, gets a cough.  Sometimes brief cough, sometimes lots of coughing.  Sleeping well.  Occasional bad cough at night, but most of the time sleeping well.  Doing well with fluid intake.  No head congestion, no drainage.  No sick contacts in the past 2 weeks.    Has had covid vaccine and booster 08/2020.  Using some lozenges some.    Did self covid test last Monday and it was negative.  Did converse with PCP but no medications were called out.   Emailed her over the weekend.    In general, healthy, swim, tennis or yoga regularly .   Denies GERD, avoids spicy foods.   Blows leaves occasionally, and did blow some leaves briefly last weekend and then over 2 weeks prior.    Nonsmoker.    No other aggravating or relieving factors.    No other c/o.  Past Medical History:  Diagnosis Date  . Adenomatous polyp of colon   . Hearing aid worn    BILATERAL  . Hypercholesterolemia    NO MEDS  . Prostate cancer [facility])      The following portions of the patient's history were reviewed and updated as appropriate: allergies, current medications, past family history, past medical history, past social history, past surgical history and problem list.  ROS Otherwise as in subjective above  Objective: BP 110/64 (BP Location: Left Arm, Patient Position: Sitting, Cuff Size: Normal)   Pulse 63   Temp 97.7 F (36.5 C)   SpO2 97%    Wt Readings from Last 3 Encounters:  08/04/20 176 lb (79.8 kg)  02/04/20 177 lb (80.3 kg)  08/06/19 169 lb (76.7 kg)   General appearance:  alert, no distress, well developed, well nourished, white male HEENT: normocephalic, sclerae anicteric, conjunctiva pink and moist, TMs pearly, nares patent, no discharge or erythema, pharynx normal Oral cavity: MMM, no lesions Neck: supple, no lymphadenopathy, no thyromegaly, no masses, no JVD Heart: RRR, normal S1, S2, no murmurs Lungs: somewhat decreased upper fields, +rhonchi upper fields, otherwise no wheezes, no rales Abdomen: +bs, soft, non tender, non distended, no masses, no hepatomegaly, no splenomegaly Pulses: 2+ radial pulses, 2+ pedal pulses, normal cap refill   Ext: no edema   Assessment: Encounter Diagnoses  Name Primary?  . Cough Yes  . Sore throat   . Other nonspecific abnormal finding of lung field      Plan: We discussed symptoms and concerns.  I suspect he is on the tail end of a recent respiratory tract infection but there was an abnormality in the lung fields.  Vital signs reviewed.  He is well-appearing today.  Oxygen level normal.  He has had the COVID-vaccine.  He will go for chest x-[facility] at [facility] tomorrow.  Declines blood count CBC today.  He had questions about COVID.  He never had full COVID symptoms, symptoms were relatively mild overall and worse 2 weeks  ago.  He had a negative home test.  There is no acute symptoms that would warrant updated testing for COVID today and want to primarily make sure no secondary bacterial pneumonia or tumor or other given the abnormal lung field.  Continue to rest, hydrate well, can use Tessalon Perles as below.  Answered his questions and he voiced understanding and agreement with plan.  Cade was seen today for covid.  Diagnoses and all orders for this visit:  Cough -     DG Chest 2 View; Future  Sore throat -     DG Chest 2 View; Future  Other nonspecific abnormal finding of lung field -     DG Chest 2 View; Future  Other orders -     benzonatate (TESSALON) 100 MG capsule; Take 1 capsule (100 mg  total) by mouth 3 (three) times daily.    Follow up: pending chest xray

## 2020-12-20 NOTE — Telephone Encounter (Signed)
Patient states he is having a cough, congestion, took a home test on 12/13/20 was negative, fully vaccinated. Patient would like to come in person. I explained to patient our policy. Patient insist for in person appoinment to be schedule. Patient decline virtual appt. Please advise

## 2020-12-21 ENCOUNTER — Other Ambulatory Visit: Payer: Self-pay

## 2020-12-21 ENCOUNTER — Ambulatory Visit (HOSPITAL_COMMUNITY)
Admission: RE | Admit: 2020-12-21 | Discharge: 2020-12-21 | Disposition: A | Discharge status: Home / Self Care | Payer: PPO | Source: Ambulatory Visit | Attending: Medical | Admitting: Medical

## 2020-12-21 ENCOUNTER — Other Ambulatory Visit: Payer: Self-pay | Admitting: Medical

## 2020-12-21 ENCOUNTER — Encounter: Payer: Self-pay | Admitting: Family Medicine

## 2020-12-21 DIAGNOSIS — J029 Acute pharyngitis, unspecified: Secondary | ICD-10-CM | POA: Diagnosis not present

## 2020-12-21 DIAGNOSIS — R918 Other nonspecific abnormal finding of lung field: Secondary | ICD-10-CM | POA: Insufficient documentation

## 2020-12-21 DIAGNOSIS — R059 Cough, unspecified: Secondary | ICD-10-CM | POA: Insufficient documentation

## 2020-12-21 DIAGNOSIS — J189 Pneumonia, unspecified organism: Secondary | ICD-10-CM | POA: Diagnosis not present

## 2020-12-21 MED ORDER — AZITHROMYCIN 250 MG PO TABS: ORAL_TABLET | ORAL | 0 refills | Status: DC

## 2021-01-24 DIAGNOSIS — Z85828 Personal history of other malignant neoplasm of skin: Secondary | ICD-10-CM | POA: Diagnosis not present

## 2021-01-24 DIAGNOSIS — L309 Dermatitis, unspecified: Secondary | ICD-10-CM | POA: Diagnosis not present

## 2021-01-24 DIAGNOSIS — L282 Other prurigo: Secondary | ICD-10-CM | POA: Diagnosis not present

## 2021-01-29 NOTE — Progress Notes (Signed)
[facility] [place], [place], [place] [address] [phone]  Date:  02/02/2021   Name:  [facility]   DOB:  10-28-1941   MRN:  [id number]  PCP:  Darreld Mclean, MD    Chief Complaint: dyslipidemia (6 month follow up/) and Pneumonia (Follow up xray)   History of Present Illness:  [facility] is a 80 y.o. very pleasant male patient who presents with the following:  Pt here today for a 6 month follow-up visit  Last seen by myself in August  Pt with history of hyperlipidemia, prostate cancer, quad rupture, hearing loss, pre-diabetes but most recent A1c normal  covid series- done  Flu vaccine - do today  Colon UTD until later this year 2nd dose shingrix? - this was done per pt, updated chart Labs August   He had pneumonia in January- he was treated and now feels back to normal, cough is resolved  Chest film 1/11 IMPRESSION: New patchy anterior right mid lung opacity, suspicious for pneumonia. Recommend follow-up PA and lateral post treatment chest radiographs in 4-6 weeks. Lab Results  Component Value Date   HGBA1C 5.4 08/04/2020   Urologist Dr Diona Fanti has turned PSA surveillance over to primary care Derm- Dr Martinique  Retired, adult children and 2 teen grands.  He plays a lot of tennis - he is really enjoying this  Patient Active Problem List   Diagnosis Date Noted  . Pre-diabetes 08/01/2020  . Hearing loss 05/25/2015  . Adenocarcinoma of prostate ([place]) 02/18/2013  . Hypercholesteremia 11/24/2012  . Personal history of colonic polyps 11/22/2012  . Quadriceps tendon rupture 08/16/2011  . Tear of meniscus, knee, medial 07/20/2011    Past Medical History:  Diagnosis Date  . Adenomatous polyp of colon   . Hearing aid worn    BILATERAL  . Hypercholesterolemia    NO MEDS  . Prostate cancer [facility])     Past Surgical History:  Procedure Laterality Date  . APPENDECTOMY    . COLONOSCOPY    .  LEG WOUND REPAIR / CLOSURE Left 2000   INJURY  . PROSTATE BIOPSY    . PROSTATE SURGERY    . RADIOACTIVE SEED IMPLANT N/A 05/29/2013   Procedure: RADIOACTIVE SEED IMPLANT;  Surgeon: Franchot Gallo, MD;  Location: [facility];  Service: Urology;  Laterality: N/A;  . TONSILLECTOMY AND ADENOIDECTOMY  AGE 80  . VASECTOMY      Social History   Tobacco Use  . Smoking status: Never Smoker  . Smokeless tobacco: Never Used  Substance Use Topics  . Alcohol use: Yes    Alcohol/week: 6.0 standard drinks    Types: 4 Glasses of wine, 2 Cans of beer per week    Comment: 5-6 drinks  . Drug use: No    Family History  Problem Relation Age of Onset  . Prostate cancer Father   . Prostate cancer Brother   . Cancer Brother   . Hyperlipidemia Brother   . Dementia Mother   . Cancer Brother   . Hyperlipidemia Brother   . Sudden death Neg Hx   . Hypertension Neg Hx   . Heart attack Neg Hx   . Diabetes Neg Hx     No Known Allergies  Medication list has been reviewed and updated.  Current Outpatient Medications on File Prior to Visit  Medication Sig Dispense Refill  . loratadine (CLARITIN) 10 MG tablet Take 10 mg  by mouth daily.    . naproxen sodium (ANAPROX) 220 MG tablet Take 220 mg by mouth as needed.      No current facility-administered medications on file prior to visit.    Review of Systems:  As per HPI- otherwise negative.   Physical Examination: Vitals:   02/02/21 0818  BP: 128/62  Pulse: 65  Resp: 17  SpO2: 97%   Vitals:   02/02/21 0818  Weight: 180 lb (81.6 kg)  Height: 5\' 11"  (1.803 m)   Body mass index is 25.1 kg/m. Ideal Body Weight: Weight in (lb) to have BMI = 25: 178.9  GEN: no acute distress.  Normal weight, looks well and younger than age 80: Atraumatic, Normocephalic.  Ears and Nose: No external deformity. CV: RRR, No M/G/R. No JVD. No thrill. No extra heart sounds. PULM: CTA B, no wheezes, crackles, rhonchi. No retractions. No  resp. distress. No accessory muscle use. ABD: S, NT, ND, +BS. No rebound. No HSM. EXTR: No c/c/e PSYCH: Normally interactive. Conversant.    Assessment and Plan: Community acquired pneumonia, unspecified laterality - Plan: DG Chest 2 View  Immunization due - Plan: Flu Vaccine QUAD High Dose(Fluad) Patient today for a follow-up visit.  He had pneumonia last month, would like to recheck x-ray today His symptoms have resolved, he is back to full activity and sports Flu vaccine given today We discussed getting labs today, patient like to defer and do at our next visit in August This visit occurred during the SARS-CoV-2 public health emergency.  Safety protocols were in place, including screening questions prior to the visit, additional usage of staff PPE, and extensive cleaning of exam room while observing appropriate contact time as indicated for disinfecting solutions.    Signed Lamar Blinks, MD  Received his chest film as below, message to patient  DG Chest 2 View  Result Date: 02/02/2021 CLINICAL DATA:  Follow-up pneumonia. EXAM: CHEST - 2 VIEW COMPARISON:  December 21, 2020. FINDINGS: The heart size and mediastinal contours are within normal limits. Both lungs are clear. The visualized skeletal structures are unremarkable. IMPRESSION: No active cardiopulmonary disease. Electronically Signed   By: Marijo Conception M.D.   On: 02/02/2021 08:57

## 2021-01-29 NOTE — Patient Instructions (Addendum)
Good to see you again today!  Assuming all is well please see me in 6 months- we can do labs at that time Flu vaccine today Continue your healthy habits!

## 2021-02-02 ENCOUNTER — Ambulatory Visit (HOSPITAL_BASED_OUTPATIENT_CLINIC_OR_DEPARTMENT_OTHER)
Admission: RE | Admit: 2021-02-02 | Discharge: 2021-02-02 | Disposition: A | Discharge status: Home / Self Care | Payer: PPO | Source: Ambulatory Visit | Attending: Family Medicine | Admitting: Family Medicine

## 2021-02-02 ENCOUNTER — Encounter: Payer: Self-pay | Admitting: Family Medicine

## 2021-02-02 ENCOUNTER — Other Ambulatory Visit: Payer: Self-pay

## 2021-02-02 ENCOUNTER — Ambulatory Visit (INDEPENDENT_AMBULATORY_CARE_PROVIDER_SITE_OTHER): Payer: PPO | Admitting: Family Medicine

## 2021-02-02 VITALS — BP 128/62 | HR 65 | Resp 17 | Ht 71.0 in | Wt 180.0 lb

## 2021-02-02 DIAGNOSIS — Z23 Encounter for immunization: Secondary | ICD-10-CM

## 2021-02-02 DIAGNOSIS — J189 Pneumonia, unspecified organism: Secondary | ICD-10-CM

## 2021-02-22 DIAGNOSIS — L309 Dermatitis, unspecified: Secondary | ICD-10-CM | POA: Diagnosis not present

## 2021-02-22 DIAGNOSIS — L821 Other seborrheic keratosis: Secondary | ICD-10-CM | POA: Diagnosis not present

## 2021-02-22 DIAGNOSIS — L282 Other prurigo: Secondary | ICD-10-CM | POA: Diagnosis not present

## 2021-02-22 DIAGNOSIS — Z85828 Personal history of other malignant neoplasm of skin: Secondary | ICD-10-CM | POA: Diagnosis not present

## 2021-03-21 ENCOUNTER — Telehealth: Payer: Self-pay | Admitting: Family Medicine

## 2021-03-21 NOTE — Telephone Encounter (Signed)
Please give him a call back- we are sorry about the loss of his brother.  I would recommend a 2nd covid booster/ 4th shot if not done already.  It looks like 4 months have passed since his 3rd dose so ok to do anytime

## 2021-03-21 NOTE — Telephone Encounter (Signed)
Patient has had his covid shots and booster. He wants to know if he should get a 3rd booster shot. His brother passed away and he will be around a crowd of people during his bereavement so he wants to know if that additional booster is needed. Please advise. Patient states she would like to get a call back by tomorrow morning so he can have time to come to our pharmacy to get shot.

## 2021-03-21 NOTE — Telephone Encounter (Signed)
Tried calling patient, no answer however I did send message via mychart.

## 2021-03-22 ENCOUNTER — Ambulatory Visit: Payer: PPO | Attending: Internal Medicine

## 2021-03-22 DIAGNOSIS — Z23 Encounter for immunization: Secondary | ICD-10-CM

## 2021-03-22 NOTE — Progress Notes (Signed)
   Covid-19 Vaccination Clinic  Name:  [facility]    MRN: [id number] DOB: 09/03/1941  03/22/2021  [name] was observed post Covid-19 immunization for 15 minutes without incident. He was provided with Vaccine Information Sheet and instruction to access the V-Safe system.   [name] was instructed to call 911 with any severe reactions post vaccine: Marland Kitchen Difficulty breathing  . Swelling of face and throat  . A fast heartbeat  . A bad rash all over body  . Dizziness and weakness   Immunizations Administered    Name Date Dose VIS Date Route   PFIZER Comrnaty(Gray TOP) Covid-19 Vaccine 03/22/2021 10:44 AM 0.3 mL 11/18/2020 Intramuscular   Manufacturer: [employer], [employer]   Lot: [id number]   NDC: [phone]

## 2021-03-28 ENCOUNTER — Other Ambulatory Visit (HOSPITAL_BASED_OUTPATIENT_CLINIC_OR_DEPARTMENT_OTHER): Payer: Self-pay

## 2021-03-28 MED ORDER — PFIZER-BIONT COVID-19 VAC-TRIS 30 MCG/0.3ML IM SUSP
INTRAMUSCULAR | 0 refills | Status: DC
  Filled 2021-03-28: qty 0.3, 1d supply, fill #0

## 2021-04-01 DIAGNOSIS — L821 Other seborrheic keratosis: Secondary | ICD-10-CM | POA: Diagnosis not present

## 2021-04-01 DIAGNOSIS — D2261 Melanocytic nevi of right upper limb, including shoulder: Secondary | ICD-10-CM | POA: Diagnosis not present

## 2021-04-01 DIAGNOSIS — D692 Other nonthrombocytopenic purpura: Secondary | ICD-10-CM | POA: Diagnosis not present

## 2021-04-01 DIAGNOSIS — L570 Actinic keratosis: Secondary | ICD-10-CM | POA: Diagnosis not present

## 2021-04-01 DIAGNOSIS — Z85828 Personal history of other malignant neoplasm of skin: Secondary | ICD-10-CM | POA: Diagnosis not present

## 2021-04-01 DIAGNOSIS — L298 Other pruritus: Secondary | ICD-10-CM | POA: Diagnosis not present

## 2021-04-01 DIAGNOSIS — D2271 Melanocytic nevi of right lower limb, including hip: Secondary | ICD-10-CM | POA: Diagnosis not present

## 2021-07-30 NOTE — Progress Notes (Addendum)
[facility] [address], [place], [place] [address] [phone]  Date:  08/04/2021   Name:  Parker Harrison   DOB:  [birth year]   MRN:  [id number]  PCP:  Darreld Mclean, MD    Chief Complaint: dyslipidemia (6 month follow up/)   History of Present Illness:  Parker Harrison is a 80 y.o. very pleasant male patient who presents with the following:  Patient seen today for 68-monthfollow-up visit Most recently seen by myself in February Pt with history of hyperlipidemia, prostate cancer, quad rupture, hearing loss, pre-diabetes but most recent A1c normal  He has been released from urology, we are now managing his PSA surveillance  Retired, he has 2 teen grandchildren; one is a HTechnical sales engineerand the other is a 9Horticulturist, commercial  He really enjoys playing tennis and is still playing quite a bit   No chest pain or shortness of breath with exercise.  He continues to be an excellent condition for his age and to feel well COVID-19 booster- done  Colonoscopy due this year Can update labs today Patient Active Problem List   Diagnosis Date Noted   Pre-diabetes 08/01/2020   Hearing loss 05/25/2015   [place]   Hypercholesteremia 11/24/2012   Personal history of colonic polyps 11/22/2012   Quadriceps tendon rupture 08/16/2011   Tear of meniscus, knee, medial 07/20/2011    Past Medical History:  Diagnosis Date   Adenomatous polyp of colon    Hearing aid worn    BILATERAL   Hypercholesterolemia    NO MEDS   [place]     Past Surgical History:  Procedure Laterality Date   APPENDECTOMY     COLONOSCOPY     LEG WOUND REPAIR / CLOSURE Left 2000   INJURY   PROSTATE BIOPSY     PROSTATE SURGERY     RADIOACTIVE SEED IMPLANT N/A 05/29/2013   Procedure: RADIOACTIVE SEED IMPLANT;  Surgeon: SFranchot Gallo MD;  [facility]  Service: Urology;  Laterality: N/A;    TONSILLECTOMY AND ADENOIDECTOMY  AGE 89   VASECTOMY      Social History   Tobacco Use   Smoking status: Never   Smokeless tobacco: Never  Substance Use Topics   Alcohol use: Yes    Alcohol/week: 6.0 standard drinks    Types: 4 Glasses of wine, 2 Cans of beer per week    Comment: 5-6 drinks   Drug use: No    Family History  Problem Relation Age of Onset   Prostate cancer Father    Prostate cancer Brother    Cancer Brother    Hyperlipidemia Brother    Dementia Mother    Cancer Brother    Hyperlipidemia Brother    Sudden death Neg Hx    Hypertension Neg Hx    Heart attack Neg Hx    Diabetes Neg Hx     No Known Allergies  Medication list has been reviewed and updated.  Current Outpatient Medications on File Prior to Visit  Medication Sig Dispense Refill   loratadine (CLARITIN) 10 MG tablet Take 10 mg by mouth daily.     naproxen sodium (ANAPROX) 220 MG tablet Take 220 mg by mouth as needed.      No current facility-administered medications on file prior to visit.    Review of Systems:  As per HPI- otherwise negative.   Physical Examination: Vitals:  08/04/21 0817  BP: 122/66  Pulse: (!) 56  Resp: 15  Temp: 98.1 F (36.7 C)  SpO2: 99%   Vitals:   08/04/21 0817  Weight: 180 lb (81.6 kg)  Height: '5\' 11"'$  (1.803 m)   Body mass index is 25.1 kg/m. Ideal Body Weight: Weight in (lb) to have BMI = 25: 178.9  GEN: no acute distress.  Normal weight, looks well  HEENT: Atraumatic, Normocephalic.  Ears and Nose: No external deformity. CV: RRR, No M/G/R. No JVD. No thrill. No extra heart sounds. PULM: CTA B, no wheezes, crackles, rhonchi. No retractions. No resp. distress. No accessory muscle use. ABD: S, NT, ND, +BS. No rebound. No HSM. EXTR: No c/c/e PSYCH: Normally interactive. Conversant.    Assessment and Plan: Pre-diabetes - Plan: Comprehensive metabolic panel, Hemoglobin A1c  Screening for deficiency anemia - Plan: CBC  History of prostate  cancer - Plan: PSA  Dyslipidemia - Plan: Lipid panel  Periodic follow-up visit today.  Labs are pending as above.  We will continue PSA surveillance for history of prostate cancer  Overall Trenden is doing great, I encouraged him to continue his active lifestyle.  We will plan to follow-up in about 6 months assuming all is well  Discussed upcoming COVID booster, flu shot, colon cancer screening This visit occurred during the SARS-CoV-2 public health emergency.  Safety protocols were in place, including screening questions prior to the visit, additional usage of staff PPE, and extensive cleaning of exam room while observing appropriate contact time as indicated for disinfecting solutions.   Signed Lamar Blinks, MD  Received labs as below, message to patient  Results for orders placed or performed in visit on 08/04/21  CBC  Result Value Ref Range   WBC 4.0 4.0 - 10.5 K/uL   RBC 4.54 4.22 - 5.81 Mil/uL   Platelets 218.0 150.0 - 400.0 K/uL   Hemoglobin 13.7 13.0 - 17.0 g/dL   HCT 41.7 39.0 - 52.0 %   MCV 91.9 78.0 - 100.0 fl   MCHC 32.8 30.0 - 36.0 g/dL   RDW 13.5 11.5 - 15.5 %  Comprehensive metabolic panel  Result Value Ref Range   Sodium 138 135 - 145 mEq/L   Potassium 4.3 3.5 - 5.1 mEq/L   Chloride 102 96 - 112 mEq/L   CO2 29 19 - 32 mEq/L   Glucose, Bld 86 70 - 99 mg/dL   BUN 19 6 - 23 mg/dL   Creatinine, Ser 1.13 0.40 - 1.50 mg/dL   Total Bilirubin 0.7 0.2 - 1.2 mg/dL   Alkaline Phosphatase 83 39 - 117 U/L   AST 25 0 - 37 U/L   ALT 24 0 - 53 U/L   Total Protein 7.1 6.0 - 8.3 g/dL   Albumin 4.4 3.5 - 5.2 g/dL   GFR 61.93 >60.00 mL/min   Calcium 9.7 8.4 - 10.5 mg/dL  Hemoglobin A1c  Result Value Ref Range   Hgb A1c MFr Bld 5.6 4.6 - 6.5 %  Lipid panel  Result Value Ref Range   Cholesterol 256 (H) 0 - 200 mg/dL   Triglycerides 102.0 0.0 - 149.0 mg/dL   HDL 61.20 >39.00 mg/dL   VLDL 20.4 0.0 - 40.0 mg/dL   LDL Cholesterol 175 (H) 0 - 99 mg/dL   Total CHOL/HDL  Ratio 4    NonHDL 194.91   PSA  Result Value Ref Range   PSA 0.00 Repeated and verified X2. (L) 0.10 - 4.00 ng/mL

## 2021-07-30 NOTE — Patient Instructions (Addendum)
It was great to see you again today, assuming all is well please see me in about 6 months Have a wonderful fall season Plan for colonoscopy later this year- let me know if you have any difficulty setting this up

## 2021-08-02 DIAGNOSIS — L309 Dermatitis, unspecified: Secondary | ICD-10-CM | POA: Diagnosis not present

## 2021-08-02 DIAGNOSIS — C44311 Basal cell carcinoma of skin of nose: Secondary | ICD-10-CM | POA: Diagnosis not present

## 2021-08-02 DIAGNOSIS — L282 Other prurigo: Secondary | ICD-10-CM | POA: Diagnosis not present

## 2021-08-02 DIAGNOSIS — Z85828 Personal history of other malignant neoplasm of skin: Secondary | ICD-10-CM | POA: Diagnosis not present

## 2021-08-04 ENCOUNTER — Ambulatory Visit (INDEPENDENT_AMBULATORY_CARE_PROVIDER_SITE_OTHER): Payer: PPO | Admitting: Family Medicine

## 2021-08-04 ENCOUNTER — Other Ambulatory Visit: Payer: Self-pay

## 2021-08-04 ENCOUNTER — Encounter: Payer: Self-pay | Admitting: Family Medicine

## 2021-08-04 VITALS — BP 122/66 | HR 56 | Temp 98.1°F | Resp 15 | Ht 71.0 in | Wt 180.0 lb

## 2021-08-04 DIAGNOSIS — R7303 Prediabetes: Secondary | ICD-10-CM

## 2021-08-04 DIAGNOSIS — Z8546 Personal history of malignant neoplasm of prostate: Secondary | ICD-10-CM | POA: Diagnosis not present

## 2021-08-04 DIAGNOSIS — E785 Hyperlipidemia, unspecified: Secondary | ICD-10-CM

## 2021-08-04 DIAGNOSIS — Z130 Encounter for screening for diseases of the blood and blood-forming organs and certain disorders involving the immune mechanism: Secondary | ICD-10-CM

## 2021-08-04 LAB — COMPREHENSIVE METABOLIC PANEL
ALT: 24 U/L (ref 0–53)
AST: 25 U/L (ref 0–37)
Albumin: 4.4 g/dL (ref 3.5–5.2)
Alkaline Phosphatase: 83 U/L (ref 39–117)
BUN: 19 mg/dL (ref 6–23)
CO2: 29 mEq/L (ref 19–32)
Calcium: 9.7 mg/dL (ref 8.4–10.5)
Chloride: 102 mEq/L (ref 96–112)
Creatinine, Ser: 1.13 mg/dL (ref 0.40–1.50)
GFR: 61.93 mL/min (ref >60.00)
Glucose, Bld: 86 mg/dL (ref 70–99)
Potassium: 4.3 mEq/L (ref 3.5–5.1)
Sodium: 138 mEq/L (ref 135–145)
Total Bilirubin: 0.7 mg/dL (ref 0.2–1.2)
Total Protein: 7.1 g/dL (ref 6.0–8.3)

## 2021-08-04 LAB — CBC
HCT: 41.7 % (ref 39.0–52.0)
Hemoglobin: 13.7 g/dL (ref 13.0–17.0)
MCHC: 32.8 g/dL (ref 30.0–36.0)
MCV: 91.9 fl (ref 78.0–100.0)
Platelets: 218 10*3/uL (ref 150.0–400.0)
RBC: 4.54 Mil/uL (ref 4.22–5.81)
RDW: 13.5 % (ref 11.5–15.5)
WBC: 4 10*3/uL (ref 4.0–10.5)

## 2021-08-04 LAB — PSA: PSA: 0 ng/mL — ABNORMAL LOW (ref 0.10–4.00)

## 2021-08-04 LAB — HEMOGLOBIN A1C: Hgb A1c MFr Bld: 5.6 % (ref 4.6–6.5)

## 2021-08-04 LAB — LIPID PANEL
Cholesterol: 256 mg/dL — ABNORMAL HIGH (ref 0–200)
HDL: 61.2 mg/dL (ref >39.00)
LDL Cholesterol: 175 mg/dL — ABNORMAL HIGH (ref 0–99)
NonHDL: 194.91
Total CHOL/HDL Ratio: 4
Triglycerides: 102 mg/dL (ref 0.0–149.0)
VLDL: 20.4 mg/dL (ref 0.0–40.0)

## 2021-08-23 DIAGNOSIS — C44311 Basal cell carcinoma of skin of nose: Secondary | ICD-10-CM | POA: Diagnosis not present

## 2021-08-23 DIAGNOSIS — Z85828 Personal history of other malignant neoplasm of skin: Secondary | ICD-10-CM | POA: Diagnosis not present

## 2021-09-01 ENCOUNTER — Other Ambulatory Visit: Payer: Self-pay

## 2021-09-01 ENCOUNTER — Encounter: Payer: Self-pay | Admitting: Family Medicine

## 2021-09-01 ENCOUNTER — Ambulatory Visit (INDEPENDENT_AMBULATORY_CARE_PROVIDER_SITE_OTHER): Payer: PPO

## 2021-09-01 DIAGNOSIS — Z23 Encounter for immunization: Secondary | ICD-10-CM

## 2021-09-01 NOTE — Progress Notes (Signed)
Parker Harrison is a 80 y.o. male presents to the office today for high dose flu shot, per physician's orders. Original order: [name]- 09/01/21 Flu Quad High Dose,  IM was administered L deltoid today. Patient tolerated injection.   Creft, Darlis Loan

## 2021-09-22 DIAGNOSIS — Z23 Encounter for immunization: Secondary | ICD-10-CM | POA: Diagnosis not present

## 2021-12-06 DIAGNOSIS — H43813 Vitreous degeneration, bilateral: Secondary | ICD-10-CM | POA: Diagnosis not present

## 2021-12-29 ENCOUNTER — Encounter: Payer: Self-pay | Admitting: Gastroenterology

## 2021-12-30 DIAGNOSIS — H2513 Age-related nuclear cataract, bilateral: Secondary | ICD-10-CM | POA: Diagnosis not present

## 2021-12-30 DIAGNOSIS — H52203 Unspecified astigmatism, bilateral: Secondary | ICD-10-CM | POA: Diagnosis not present

## 2021-12-30 DIAGNOSIS — H5213 Myopia, bilateral: Secondary | ICD-10-CM | POA: Diagnosis not present

## 2022-01-19 DIAGNOSIS — H25811 Combined forms of age-related cataract, right eye: Secondary | ICD-10-CM | POA: Diagnosis not present

## 2022-01-19 DIAGNOSIS — H2511 Age-related nuclear cataract, right eye: Secondary | ICD-10-CM | POA: Diagnosis not present

## 2022-01-24 ENCOUNTER — Ambulatory Visit: Payer: PPO | Admitting: Gastroenterology

## 2022-01-24 ENCOUNTER — Encounter: Payer: Self-pay | Admitting: Gastroenterology

## 2022-01-24 VITALS — BP 128/62 | HR 64 | Ht 71.0 in | Wt 181.0 lb

## 2022-01-24 DIAGNOSIS — Z1211 Encounter for screening for malignant neoplasm of colon: Secondary | ICD-10-CM

## 2022-01-24 MED ORDER — NA SULFATE-K SULFATE-MG SULF 17.5-3.13-1.6 GM/177ML PO SOLN: 1.0000 | ORAL | 0 refills | Status: DC

## 2022-01-24 NOTE — Progress Notes (Signed)
Review of pertinent gastrointestinal problems: 1.  Adenomatous colon polyps.  Colonoscopy Dr. Inocente Salles 2007 removed a 9 mm tubular adenoma.  Colonoscopy Dr. Ardis Hughs 2012 was completely normal.   Blood work August 2022 CBC was normal   HPI: This is a very pleasant 81 year old man whom I last saw about 10 years ago the time of a screening colonoscopy.  He is in very good overall health.  He is only on as needed NSAID.  He is active.  He recently had cataract surgery on his right eye and is planning cataract surgery on his left eye.  His parents lived well into their 21s.  He has no GI symptoms.  Specifically no serious changes in his bowels.  No constipation, no bleeding, no diarrhea, no abdominal pains.  His weight has been overall stable   Review of systems: Pertinent positive and negative review of systems were noted in the above HPI section. All other review negative.   Past Medical History:  Diagnosis Date   Adenomatous polyp of colon    Hearing aid worn    BILATERAL   Hypercholesterolemia    NO MEDS   Prostate cancer ([place])     Past Surgical History:  Procedure Laterality Date   APPENDECTOMY     COLONOSCOPY     LEG WOUND REPAIR / CLOSURE Left 2000   INJURY   PROSTATE BIOPSY     PROSTATE SURGERY     RADIOACTIVE SEED IMPLANT N/A 05/29/2013   Procedure: RADIOACTIVE SEED IMPLANT;  Surgeon: Franchot Gallo, MD;  Location: [facility];  Service: Urology;  Laterality: N/A;   TONSILLECTOMY AND ADENOIDECTOMY  AGE 46   VASECTOMY      Current Outpatient Medications  Medication Sig Dispense Refill   naproxen sodium (ANAPROX) 220 MG tablet Take 220 mg by mouth as needed.      No current facility-administered medications for this visit.    Allergies as of 01/24/2022   (No Known Allergies)    Family History  Problem Relation Age of Onset   Prostate cancer Father    Prostate cancer Brother    Cancer Brother    Hyperlipidemia Brother    Dementia Mother     Cancer Brother    Hyperlipidemia Brother    Sudden death Neg Hx    Hypertension Neg Hx    Heart attack Neg Hx    Diabetes Neg Hx     Social History   Socioeconomic History   Marital status: Married    Spouse name: Not on file   Number of children: 2   Years of education: Not on file   Highest education level: Not on file  Occupational History   Occupation: retired  Tobacco Use   Smoking status: Never   Smokeless tobacco: Never  Substance and Sexual Activity   Alcohol use: Yes    Alcohol/week: 6.0 standard drinks    Types: 4 Glasses of wine, 2 Cans of beer per week    Comment: 5-6 drinks   Drug use: No   Sexual activity: Not on file  Other Topics Concern   Not on file  Social History Narrative   Married. Education: [employer].   Retired   Investment banker, operational of Radio broadcast assistant Strain: Not on [employer] Insecurity: Not on file  Transportation Needs: Not on file  Physical Activity: Not on file  Stress: Not on file  Social Connections: Not on file  Intimate Partner Violence: Not on file  Physical Exam: BP 128/62    Pulse 64    Ht 5\' 11"  (1.803 m)    Wt 181 lb (82.1 kg)    BMI 25.24 kg/m  Constitutional: generally well-appearing Psychiatric: alert and oriented x3 Eyes: extraocular movements intact Mouth: oral pharynx moist, no lesions Neck: supple no lymphadenopathy Cardiovascular: heart regular rate and rhythm Lungs: clear to auscultation bilaterally Abdomen: soft, nontender, nondistended, no obvious ascites, no peritoneal signs, normal bowel sounds Extremities: no lower extremity edema bilaterally Skin: no lesions on visible extremities   Assessment and plan: 81 y.o. male with routine risk for colon cancer  He is in very good overall health and I think colon cancer screening is still a reasonable clinical issue for him.  I recommended colonoscopy at his soonest convenience and see no reason for blood tests or imaging studies prior to  then.    Please see the "Patient Instructions" section for addition details about the plan.   Owens Loffler, MD [facility] 01/24/2022, 3:23 PM  Cc: Darreld Mclean, MD  Total time on date of encounter was 40  minutes (this included time spent preparing to see the patient reviewing records; obtaining and/or reviewing separately obtained history; performing a medically appropriate exam and/or evaluation; counseling and educating the patient and family if present; ordering medications, tests or procedures if applicable; and documenting clinical information in the health record).

## 2022-01-24 NOTE — Patient Instructions (Signed)
If you are age 80 or older, your body mass index should be between 23-30. Your Body mass index is 25.24 kg/m. If this is out of the aforementioned range listed, please consider follow up with your Primary Care Provider.  The [facility] providers would like to encourage you to use [facility] to communicate with providers for non-urgent requests or questions.  Due to long hold times on the telephone, sending your provider a message by [facility] may be a faster and more efficient way to get a response.  Please allow 48 business hours for a response.  Please remember that this is for non-urgent requests.  _______________________________________________________  Dennis Bast have been scheduled for a colonoscopy. Please follow written instructions given to you at your visit today.  Please pick up your prep supplies at the pharmacy within the next 1-3 days. If you use inhalers (even only as needed), please bring them with you on the day of your procedure.  Due to recent changes in healthcare laws, you may see the results of your imaging and laboratory studies on MyChart before your provider has had a chance to review them.  We understand that in some cases there may be results that are confusing or concerning to you. Not all laboratory results come back in the same time frame and the provider may be waiting for multiple results in order to interpret others.  Please give [place] 48 hours in order for your provider to thoroughly review all the results before contacting the office for clarification of your results.   Thank you for entrusting me with your care and choosing [facility].  Dr Olena Mater

## 2022-01-25 ENCOUNTER — Telehealth: Payer: Self-pay | Admitting: Gastroenterology

## 2022-01-25 NOTE — Telephone Encounter (Signed)
Patient came yesterday.  Went to pharmacy today and his Suprep was over $100.  Can you offer him some less expensive?  Thank you.

## 2022-01-25 NOTE — Telephone Encounter (Signed)
Returned call to patient regarding cost of Suprep.  Patient would like to try a cheaper alternative.  Patient advised that I would send him new instructions using Miralax, dulcolax, and gatorade by MyChart.  Patient agreed to plan and verbalized understanding.  No further questions.

## 2022-02-02 DIAGNOSIS — H2512 Age-related nuclear cataract, left eye: Secondary | ICD-10-CM | POA: Diagnosis not present

## 2022-02-02 DIAGNOSIS — H25812 Combined forms of age-related cataract, left eye: Secondary | ICD-10-CM | POA: Diagnosis not present

## 2022-02-07 ENCOUNTER — Other Ambulatory Visit: Payer: Self-pay

## 2022-02-07 ENCOUNTER — Encounter: Payer: Self-pay | Admitting: Gastroenterology

## 2022-02-07 ENCOUNTER — Ambulatory Visit (AMBULATORY_SURGERY_CENTER): Payer: PPO | Admitting: Gastroenterology

## 2022-02-07 VITALS — BP 120/69 | HR 45 | Temp 96.8°F | Resp 10 | Ht 71.0 in | Wt 181.0 lb

## 2022-02-07 DIAGNOSIS — Z1211 Encounter for screening for malignant neoplasm of colon: Secondary | ICD-10-CM | POA: Diagnosis not present

## 2022-02-07 DIAGNOSIS — Z86010 Personal history of colonic polyps: Secondary | ICD-10-CM | POA: Diagnosis not present

## 2022-02-07 MED ORDER — SODIUM CHLORIDE 0.9 % IV SOLN: 500.0000 mL | Freq: Once | INTRAVENOUS | Status: DC

## 2022-02-07 NOTE — Patient Instructions (Signed)
Resume previous diet and medications. Repeat Colonoscopy not recommended due to age.  YOU HAD AN ENDOSCOPIC PROCEDURE TODAY AT [facility]:   Refer to the procedure report that was given to you for any specific questions about what was found during the examination.  If the procedure report does not answer your questions, please call your gastroenterologist to clarify.  If you requested that your care partner not be given the details of your procedure findings, then the procedure report has been included in a sealed envelope for you to review at your convenience later.  YOU SHOULD EXPECT: Some feelings of bloating in the abdomen. Passage of more gas than usual.  Walking can help get rid of the air that was put into your GI tract during the procedure and reduce the bloating. If you had a lower endoscopy (such as a colonoscopy or flexible sigmoidoscopy) you may notice spotting of blood in your stool or on the toilet paper. If you underwent a bowel prep for your procedure, you may not have a normal bowel movement for a few days.  Please Note:  You might notice some irritation and congestion in your nose or some drainage.  This is from the oxygen used during your procedure.  There is no need for concern and it should clear up in a day or so.  SYMPTOMS TO REPORT IMMEDIATELY:  Following lower endoscopy (colonoscopy or flexible sigmoidoscopy):  Excessive amounts of blood in the stool  Significant tenderness or worsening of abdominal pains  Swelling of the abdomen that is new, acute  Fever of 100F or higher  For urgent or emergent issues, a gastroenterologist can be reached at any hour by calling [phone]. Do not use MyChart messaging for urgent concerns.    DIET:  We do recommend a small meal at first, but then you may proceed to your regular diet.  Drink plenty of fluids but you should avoid alcoholic beverages for 24 hours.  ACTIVITY:  You should plan to take it easy for the  rest of today and you should NOT DRIVE or use heavy machinery until tomorrow (because of the sedation medicines used during the test).    FOLLOW UP: Our staff will call the number listed on your records 48-72 hours following your procedure to check on you and address any questions or concerns that you may have regarding the information given to you following your procedure. If we do not reach you, we will leave a message.  We will attempt to reach you two times.  During this call, we will ask if you have developed any symptoms of COVID 19. If you develop any symptoms (ie: fever, flu-like symptoms, shortness of breath, cough etc.) before then, please call [phone].  If you test positive for Covid 19 in the 2 weeks post procedure, please call and report this information to [place].    If any biopsies were taken you will be contacted by phone or by letter within the next 1-3 weeks.  Please call us at [phone] if you have not heard about the biopsies in 3 weeks.    SIGNATURES/CONFIDENTIALITY: You and/or your care partner have signed paperwork which will be entered into your electronic medical record.  These signatures attest to the fact that that the information above on your After Visit Summary has been reviewed and is understood.  Full responsibility of the confidentiality of this discharge information lies with you and/or your care-partner.

## 2022-02-07 NOTE — Progress Notes (Signed)
VS- Parker Harrison. °

## 2022-02-07 NOTE — Progress Notes (Signed)
Sedate, gd SR, tolerated procedure well, VSS, report to RN 

## 2022-02-07 NOTE — Op Note (Signed)
[facility] Patient Name: Parker Harrison Procedure Date: 02/07/2022 10:07 AM MRN: [id number] Endoscopist: Milus Banister , MD Age: 81 Referring MD:  Date of Birth: [birth year] Gender: Male Account #: [id number] Procedure:                Colonoscopy Indications:              Screening for colorectal malignant neoplasm;                            Colonoscopy 2007 single subCM adenoma. Colonscopy                            2012 normal Medicines:                Monitored Anesthesia Care Procedure:                Pre-Anesthesia Assessment:                           - Prior to the procedure, a History and Physical                            was performed, and patient medications and                            allergies were reviewed. The patient's tolerance of                            previous anesthesia was also reviewed. The risks                            and benefits of the procedure and the sedation                            options and risks were discussed with the patient.                            All questions were answered, and informed consent                            was obtained. Prior Anticoagulants: The patient has                            taken no previous anticoagulant or antiplatelet                            agents. ASA Grade Assessment: II - A patient with                            mild systemic disease. After reviewing the risks                            and benefits, the patient was deemed in  satisfactory condition to undergo the procedure.                           After obtaining informed consent, the colonoscope                            was passed under direct vision. Throughout the                            procedure, the patient's blood pressure, pulse, and                            oxygen saturations were monitored continuously. The                            Olympus CF-HQ190L ([id number]) Colonoscope was                             introduced through the anus and advanced to the the                            cecum, identified by appendiceal orifice and                            ileocecal valve. The colonoscopy was performed                            without difficulty. The patient tolerated the                            procedure well. The quality of the bowel                            preparation was good. The ileocecal valve,                            appendiceal orifice, and rectum were photographed. Scope In: 10:10:44 AM Scope Out: 10:27:12 AM Scope Withdrawal Time: 0 hours 11 minutes 18 seconds  Total Procedure Duration: 0 hours 16 minutes 28 seconds  Findings:                 Internal hemorrhoids were found. The hemorrhoids                            were small.                           The exam was otherwise without abnormality on                            direct and retroflexion views. Complications:            No immediate complications. Estimated blood loss:                            None. Estimated Blood Loss:  Estimated blood loss: none. Impression:               - Internal hemorrhoids.                           - The examination was otherwise normal on direct                            and retroflexion views.                           - No polyps or cancers. Recommendation:           - Patient has a contact number available for                            emergencies. The signs and symptoms of potential                            delayed complications were discussed with the                            patient. Return to normal activities tomorrow.                            Written discharge instructions were provided to the                            patient.                           - Resume previous diet.                           - Continue present medications.                           - You do not need any further colon cancer                            screening  tests (including stool testing). These                            types of tests generally stop around age 46-80. Milus Banister, MD 02/07/2022 10:29:55 AM This report has been signed electronically.

## 2022-02-07 NOTE — Progress Notes (Signed)
°  The recent H&P (dated 01/24/2022) was reviewed, the patient was examined and there is no change in the patients condition since that H&P was completed.   Parker Harrison  02/07/2022, 10:02 AM

## 2022-02-09 ENCOUNTER — Telehealth: Payer: Self-pay | Admitting: *Deleted

## 2022-02-09 ENCOUNTER — Telehealth: Payer: Self-pay

## 2022-02-09 NOTE — Telephone Encounter (Signed)
?  Follow up Call- ? ?Call back number 02/07/2022  ?Post procedure Call Back phone  # [phone]  ?Permission to leave phone message Yes  ?Some recent data might be hidden  ?  ? ?Patient questions: ? ?Do you have a fever, pain , or abdominal swelling? No. ?Pain Score  0 * ? ?Have you tolerated food without any problems? Yes.   ? ?Have you been able to return to your normal activities? Yes.   ? ?Do you have any questions about your discharge instructions: ?Diet   No. ?Medications  No. ?Follow up visit  No. ? ?Do you have questions or concerns about your Care? No. ? ?Actions: ?* If pain score is 4 or above: ?No action needed, pain <4. ? ?Have you developed a fever since your procedure? no ? ?2.   Have you had an respiratory symptoms (SOB or cough) since your procedure? no ? ?3.   Have you tested positive for COVID 19 since your procedure no ? ?4.   Have you had any family members/close contacts diagnosed with the COVID 19 since your procedure?  no ? ? ?If yes to any of these questions please route to Joylene John, RN and Joella Prince, RN  ? ? ?

## 2022-02-09 NOTE — Telephone Encounter (Signed)
No answer, left message to call back later today, B.Schwartz RN. 

## 2022-02-11 NOTE — Progress Notes (Signed)
Therapist, music at [facility] [employer] ?[address], [address] ?[place], [place] [address] ?336 [phone] ?Fax [phone] ? ?Date:  02/15/2022  ? ?Name:  Parker Harrison   DOB:  Sep 10, 1941   MRN:  [id number] ? ?PCP:  Darreld Mclean, MD  ? ? ?Chief Complaint: 6 month follow up (Concerns/ questions: 1. Issues with the left thumb. /) ? ? ?History of Present Illness: ? ?Parker Harrison is a 81 y.o. very pleasant male patient who presents with the following: ? ?Patient seen today for follow-up- Pt with history of hyperlipidemia, prostate cancer, quad rupture, hearing loss, pre-diabetes but most recent A1c normal ?Last seen by myself in August ? ?He had a colonoscopy last month- negative  ?Continues to follow-up with his dermatologist- every 6 months  ?He no longer sees urology, will follow his PSA surveillance ?Lab Results  ?Component Value Date  ? PSA 0.00 Repeated and verified X2. (L) 08/04/2021  ? PSA <0.1 08/04/2020  ? PSA 0.029 11/26/2019  ? ? ?He is retired, 1 grandchild is a Equities trader in high school and the other is a Museum/gallery exhibitions officer ? ?Plays quite a bit of tennis ?He had his cataracts removed since our last visit and is very happy with the result  ? ?Can offer Pneumovax 23 if he would like ?Labs done in August-A1c 5.6%, PSA 0 ? ?He has noted intermittent pain in the base of his left thumb ?He cannot recall any injury  ?He is right handed  ?Does not hurt during tennis - rarely bothersome  ?Not a big deal to him but thought he would mention it ? ?Today Brand tells me he is concerned about his wife- she is not exercising at all, and is regularly drinking alcohol while taking ambien (not rx by myself).  I offered support, I am seeing her in a couple of weeks and am glad to discuss any concerns that she may bring up.  We wonder if she is having some depression  ?Patient Active Problem List  ? Diagnosis Date Noted  ? Pre-diabetes 08/01/2020  ? Hearing loss 05/25/2015  ? Adenocarcinoma of prostate ([facility]) 02/18/2013  ?  Hypercholesteremia 11/24/2012  ? Personal history of colonic polyps 11/22/2012  ? Quadriceps tendon rupture 08/16/2011  ? Tear of meniscus, knee, medial 07/20/2011  ? ? ?Past Medical History:  ?Diagnosis Date  ? Adenomatous polyp of colon   ? Cataract   ? Cataract   ? bilateral  ? Hearing aid worn   ? BILATERAL  ? Hypercholesterolemia   ? NO MEDS  ? Prostate cancer ([place])   ? ? ?Past Surgical History:  ?Procedure Laterality Date  ? APPENDECTOMY    ? CATARACT EXTRACTION    ? right- 01-19-22, left 02-02-22  ? COLONOSCOPY    ? LEG WOUND REPAIR / CLOSURE Left 12/11/1998  ? INJURY  ? PROSTATE BIOPSY    ? PROSTATE SURGERY    ? RADIOACTIVE SEED IMPLANT N/A 05/29/2013  ? Procedure: RADIOACTIVE SEED IMPLANT;  Surgeon: Franchot Gallo, MD;  [facility];  Service: Urology;  Laterality: N/A;  ? TONSILLECTOMY AND ADENOIDECTOMY  AGE 41  ? VASECTOMY    ? ? ?Social History  ? ?Tobacco Use  ? Smoking status: Never  ? Smokeless tobacco: Never  ?Vaping Use  ? Vaping Use: Never used  ?Substance Use Topics  ? Alcohol use: Yes  ?  Alcohol/week: 6.0 standard drinks  ?  Types: 4 Glasses of wine, 2 Cans of  beer per week  ?  Comment: 5-6 drinks  ? Drug use: No  ? ? ?Family History  ?Problem Relation Age of Onset  ? Dementia Mother   ? Prostate cancer Father   ? Prostate cancer Brother   ? Cancer Brother   ? Hyperlipidemia Brother   ? Cancer Brother   ? Hyperlipidemia Brother   ? Sudden death Neg Hx   ? Hypertension Neg Hx   ? Heart attack Neg Hx   ? Diabetes Neg Hx   ? Colon cancer Neg Hx   ? Esophageal cancer Neg Hx   ? Stomach cancer Neg Hx   ? Rectal cancer Neg Hx   ? ? ?No Known Allergies ? ?Medication list has been reviewed and updated. ? ?Current Outpatient Medications on File Prior to Visit  ?Medication Sig Dispense Refill  ? naproxen sodium (ANAPROX) 220 MG tablet Take 220 mg by mouth as needed.     ? ?No current facility-administered medications on file prior to visit.  ? ? ?Review of Systems: ? ?As per HPI-  otherwise negative. ? ?No CP or SOB with exercise  ?Physical Examination: ?Vitals:  ? 02/15/22 0817  ?BP: 122/72  ?Pulse: 62  ?Resp: 18  ?Temp: 97.6 ?F (36.4 ?C)  ?SpO2: 98%  ? ?Vitals:  ? 02/15/22 0817  ?Weight: 171 lb 6.4 oz (77.7 kg)  ?Height: '5\' 11"'$  (1.803 m)  ? ?Body mass index is 23.91 kg/m?. ?Ideal Body Weight: Weight in (lb) to have BMI = 25: 178.9 ? ?GEN: no acute distress. ?HEENT: Atraumatic, Normocephalic.  ?Ears and Nose: No external deformity. ?CV: RRR, No M/G/R. No JVD. No thrill. No extra heart sounds. ?PULM: CTA B, no wheezes, crackles, rhonchi. No retractions. No resp. distress. No accessory muscle use. ?ABD: S, NT, ND, +BS. No rebound. No HSM. ?EXTR: No c/c/e ?PSYCH: Normally interactive. Conversant.  ?Left thumb; minor MCP joint thickening and stiffness c/w OA  ? ?Assessment and Plan: ?History of prostate cancer ? ?Dyslipidemia ? ?Pre-diabetes ? ?Immunization due - Plan: Pneumococcal polysaccharide vaccine 23-valent greater than or equal to 2yo subcutaneous/IM ? ?Stressful life events affecting family and household ? ?Osteoarthritis of thumb, left ? ?Labs are UTD ?Pt would prefer to do BW at our next visit in about 6 months ?Discussed likely mild OA of thumb- supportive measures that may be helpful.  Right now sx are mild and he does not desire any other intervention ?Updated pneumonia vaccine ?Discussed stressors at home and offered support  ? ?Signed ?Lamar Blinks, MD ? ?

## 2022-02-11 NOTE — Patient Instructions (Addendum)
It was good to see you again today!  ? ?Please see me in about 6 months for your physical and labs ?Pneumonia vaccine today ?For your thumb perhaps try tylenol/ voltaren gel as needed.  Let me know if getting worse!  ?

## 2022-02-15 ENCOUNTER — Ambulatory Visit (INDEPENDENT_AMBULATORY_CARE_PROVIDER_SITE_OTHER): Payer: PPO | Admitting: Family Medicine

## 2022-02-15 VITALS — BP 122/72 | HR 62 | Temp 97.6°F | Resp 18 | Ht 71.0 in | Wt 171.4 lb

## 2022-02-15 DIAGNOSIS — E785 Hyperlipidemia, unspecified: Secondary | ICD-10-CM | POA: Diagnosis not present

## 2022-02-15 DIAGNOSIS — R7303 Prediabetes: Secondary | ICD-10-CM | POA: Diagnosis not present

## 2022-02-15 DIAGNOSIS — Z23 Encounter for immunization: Secondary | ICD-10-CM

## 2022-02-15 DIAGNOSIS — M1812 Unilateral primary osteoarthritis of first carpometacarpal joint, left hand: Secondary | ICD-10-CM | POA: Diagnosis not present

## 2022-02-15 DIAGNOSIS — Z8546 Personal history of malignant neoplasm of prostate: Secondary | ICD-10-CM | POA: Diagnosis not present

## 2022-02-15 DIAGNOSIS — Z6379 Other stressful life events affecting family and household: Secondary | ICD-10-CM

## 2022-03-30 DIAGNOSIS — M2012 Hallux valgus (acquired), left foot: Secondary | ICD-10-CM | POA: Diagnosis not present

## 2022-03-30 DIAGNOSIS — M2011 Hallux valgus (acquired), right foot: Secondary | ICD-10-CM | POA: Diagnosis not present

## 2022-03-30 DIAGNOSIS — M21621 Bunionette of right foot: Secondary | ICD-10-CM | POA: Diagnosis not present

## 2022-03-30 DIAGNOSIS — M2042 Other hammer toe(s) (acquired), left foot: Secondary | ICD-10-CM | POA: Diagnosis not present

## 2022-03-30 DIAGNOSIS — I70203 Unspecified atherosclerosis of native arteries of extremities, bilateral legs: Secondary | ICD-10-CM | POA: Diagnosis not present

## 2022-03-30 DIAGNOSIS — M21622 Bunionette of left foot: Secondary | ICD-10-CM | POA: Diagnosis not present

## 2022-03-30 DIAGNOSIS — M2041 Other hammer toe(s) (acquired), right foot: Secondary | ICD-10-CM | POA: Diagnosis not present

## 2022-03-30 DIAGNOSIS — B353 Tinea pedis: Secondary | ICD-10-CM | POA: Diagnosis not present

## 2022-04-27 DIAGNOSIS — B353 Tinea pedis: Secondary | ICD-10-CM | POA: Diagnosis not present

## 2022-04-27 DIAGNOSIS — M722 Plantar fascial fibromatosis: Secondary | ICD-10-CM | POA: Diagnosis not present

## 2022-05-02 DIAGNOSIS — L309 Dermatitis, unspecified: Secondary | ICD-10-CM | POA: Diagnosis not present

## 2022-05-02 DIAGNOSIS — L570 Actinic keratosis: Secondary | ICD-10-CM | POA: Diagnosis not present

## 2022-05-02 DIAGNOSIS — D2271 Melanocytic nevi of right lower limb, including hip: Secondary | ICD-10-CM | POA: Diagnosis not present

## 2022-05-02 DIAGNOSIS — Z85828 Personal history of other malignant neoplasm of skin: Secondary | ICD-10-CM | POA: Diagnosis not present

## 2022-05-02 DIAGNOSIS — L821 Other seborrheic keratosis: Secondary | ICD-10-CM | POA: Diagnosis not present

## 2022-05-22 ENCOUNTER — Encounter: Payer: Self-pay | Admitting: Family Medicine

## 2022-07-26 DIAGNOSIS — H903 Sensorineural hearing loss, bilateral: Secondary | ICD-10-CM | POA: Diagnosis not present

## 2022-08-20 ENCOUNTER — Encounter: Payer: Self-pay | Admitting: Family Medicine

## 2022-08-20 NOTE — Telephone Encounter (Signed)
Called pt- he notes he otherwise feels quite well, played tennis yesterday.  He does not think he needs any more urgent eval He will seek care if anything is getting worse No Cp or SOB

## 2022-08-21 ENCOUNTER — Ambulatory Visit (INDEPENDENT_AMBULATORY_CARE_PROVIDER_SITE_OTHER): Payer: PPO | Admitting: Family Medicine

## 2022-08-21 ENCOUNTER — Encounter: Payer: Self-pay | Admitting: Family Medicine

## 2022-08-21 ENCOUNTER — Ambulatory Visit: Payer: PPO | Admitting: Family Medicine

## 2022-08-21 VITALS — BP 110/70 | HR 68 | Temp 97.8°F | Resp 18 | Ht 71.0 in | Wt 171.2 lb

## 2022-08-21 DIAGNOSIS — Z23 Encounter for immunization: Secondary | ICD-10-CM

## 2022-08-21 DIAGNOSIS — S29019A Strain of muscle and tendon of unspecified wall of thorax, initial encounter: Secondary | ICD-10-CM | POA: Diagnosis not present

## 2022-08-21 MED ORDER — METHOCARBAMOL 500 MG PO TABS: 500.0000 mg | ORAL_TABLET | Freq: Three times a day (TID) | ORAL | 1 refills | Status: DC | PRN

## 2022-08-21 NOTE — Progress Notes (Signed)
[place] at [facility] [address] [place], [place] [address] [phone]  Date:  08/21/2022   Name:  Parker Harrison   DOB:  06-12-1941   MRN:  [id number]  PCP:  Darreld Mclean, MD    Chief Complaint: possible pinched nerve (R shoulder blade. Started Saturday evening. No injury. )   History of Present Illness:  Parker Harrison is a 81 y.o. very pleasant male patient who presents with the following:  Patient seen today with concern of pain in his back and neck Most recent visit with myself was in March for routine checkup- history of hyperlipidemia, prostate cancer, quad rupture, hearing loss, pre-diabetes but most recent A1c normal We have taken over his PSA surveillance Lab Results  Component Value Date   PSA 0.00 Repeated and verified X2. (L) 08/04/2021   PSA <0.1 08/04/2020   PSA 0.029 11/26/2019    He contacted [place] yesterday with the following: Last evening I started having a strong pain in the area of my right shoulder blade that extends up into my neck, right arm and back.  It lessens with some activities but feels like a pinched nerve?  I slept in a recliner last night and it was actually better this morning.  But its back now.  I have experienced possible symptoms for several weeks but ignored or worked through them.  Heat helped a little for a short time.  Aleve helped me sleep but have not taken any today.  We were supposed to go to the beach today for five nights but I need relief first!  Today is Monday- Saturday night he noticed a sharp pain in the right shoulder blade It bothered him a lot Saturday night through to Sunday He had to sleep in his recliner the last 2 nights in order to be comfortable.  He notes more pain if he turns his head to the right He did not do anything unusual that he can think of-he is always very active He played tennis and swam last week as he normally does -however, on further review Fin  notes he was not able to swim for a few months recently due to cataract surgery.  Last week was his first time returning to swimming, he swam about a mile 2 days last week.  He typically uses backstroke  He is "a lot" better today He took some aleve for the last couple of days which did help He denies any chest pain, shortness of breath, sweating or shakiness, abdominal pain, fever /chills, cough Patient Active Problem List   Diagnosis Date Noted   Pre-diabetes 08/01/2020   Hearing loss 05/25/2015   Adenocarcinoma of prostate ([place]) 02/18/2013   Hypercholesteremia 11/24/2012   Personal history of colonic polyps 11/22/2012   Quadriceps tendon rupture 08/16/2011   Tear of meniscus, knee, medial 07/20/2011    Past Medical History:  Diagnosis Date   Adenomatous polyp of colon    Cataract    Cataract    bilateral   Hearing aid worn    BILATERAL   Hypercholesterolemia    NO MEDS   Prostate cancer [facility])     Past Surgical History:  Procedure Laterality Date   APPENDECTOMY     CATARACT EXTRACTION     right- 01-19-22, left 02-02-22   COLONOSCOPY     LEG WOUND REPAIR / CLOSURE Left 12/11/1998   INJURY   PROSTATE BIOPSY     PROSTATE  SURGERY     RADIOACTIVE SEED IMPLANT N/A 05/29/2013   Procedure: RADIOACTIVE SEED IMPLANT;  Surgeon: Franchot Gallo, MD;  Location: [facility];  Service: Urology;  Laterality: N/A;   TONSILLECTOMY AND ADENOIDECTOMY  AGE 35   VASECTOMY      Social History   Tobacco Use   Smoking status: Never   Smokeless tobacco: Never  Vaping Use   Vaping Use: Never used  Substance Use Topics   Alcohol use: Yes    Alcohol/week: 6.0 standard drinks of alcohol    Types: 4 Glasses of wine, 2 Cans of beer per week    Comment: 5-6 drinks   Drug use: No    Family History  Problem Relation Age of Onset   Dementia Mother    Prostate cancer Father    Prostate cancer Brother    Cancer Brother    Hyperlipidemia Brother    Cancer Brother     Hyperlipidemia Brother    Sudden death Neg Hx    Hypertension Neg Hx    Heart attack Neg Hx    Diabetes Neg Hx    Colon cancer Neg Hx    Esophageal cancer Neg Hx    Stomach cancer Neg Hx    Rectal cancer Neg Hx     No Known Allergies  Medication list has been reviewed and updated.  No current outpatient medications on file prior to visit.   No current facility-administered medications on file prior to visit.    Review of Systems:  As per HPI- otherwise negative.  BP Readings from Last 3 Encounters:  08/21/22 110/70  02/15/22 122/72  02/07/22 120/69    Physical Examination: Vitals:   08/21/22 1139  BP: 110/70  Pulse: 68  Resp: 18  Temp: 97.8 F (36.6 C)  SpO2: 98%   Vitals:   08/21/22 1139  Weight: 171 lb 3.2 oz (77.7 kg)  Height: '5\' 11"'$  (1.803 m)   Body mass index is 23.88 kg/m. Ideal Body Weight: Weight in (lb) to have BMI = 25: 178.9  GEN: no acute distress.  Normal weight, looks well and younger than age 74: Atraumatic, Normocephalic.  Ears and Nose: No external deformity. CV: RRR, No M/G/R. No JVD. No thrill. No extra heart sounds. PULM: CTA B, no wheezes, crackles, rhonchi. No retractions. No resp. distress. No accessory muscle use. ABD: S, NT, ND, +BS. No rebound. No HSM. EXTR: No c/c/e PSYCH: Normally interactive. Conversant.  I am able to reproduce tenderness by pressing on the thoracic muscles just medial to the right scapula.  Skin is normal, no evidence of zoster.  Normal upper extremity strength and range of motion except right shoulder which has some chronic changes  Assessment and Plan: Thoracic myofascial strain, initial encounter - Plan: methocarbamol (ROBAXIN) 500 MG tablet  Need for influenza vaccination - Plan: Flu Vaccine QUAD High Dose(Fluad)  Patient seen today with what appears to be a thoracic muscle or myofascial strain.  Suspect this may be related to return to swimming-he swim a mile 2 days last week after a several month  break in his swimming routine Encouraged him to get back into swimming routine and then gradually Okay to continue NSAIDs as needed, heat and stretching may be helpful We discussed other potential more serious etiologies; however, patient is overall feeling quite well, he is much improved and is comfortable.  Other etiologies are less likely.  For the time being he declines further evaluation today.  I prescribed Robaxin to use  as needed, caution regarding sedation  Update flu shot today See patient instructions for more details.     Signed Lamar Blinks, MD

## 2022-08-21 NOTE — Patient Instructions (Signed)
It was good to see you today- have a great time at the beach!  Use the robaxin as needed for any pain in your back but do be cautious of sedation If you are not continuing to improve over the next few days please let us know!   IF any severe symptoms such as shortness of breath, more severe pain or chest pressure or pain please seek care right away

## 2022-08-24 NOTE — Progress Notes (Unsigned)
[place] at [facility] [address], [place], [place] [address] [phone]  Date:  08/28/2022   Name:  Parker Harrison   DOB:  1941-03-07   MRN:  [id number]  PCP:  Darreld Mclean, MD    Chief Complaint: No chief complaint on file.   History of Present Illness:  Parker Harrison is a 81 y.o. very pleasant male patient who presents with the following:  Patient is seen today for periodic follow-up and labs- - history of hyperlipidemia, prostate cancer, quad rupture, hearing loss, pre-diabetes but most recent A1c normal We have taken over his PSA surveillance Actually saw him last week due to an upper back pain; he was not fasting and preferred to come back today for planned lab work Most recent lab work about 1 year ago-can update today Patient Active Problem List   Diagnosis Date Noted   Pre-diabetes 08/01/2020   Hearing loss 05/25/2015   Adenocarcinoma of prostate ([place]) 02/18/2013   Hypercholesteremia 11/24/2012   Personal history of colonic polyps 11/22/2012   Quadriceps tendon rupture 08/16/2011   Tear of meniscus, knee, medial 07/20/2011    Past Medical History:  Diagnosis Date   Adenomatous polyp of colon    Cataract    Cataract    bilateral   Hearing aid worn    BILATERAL   Hypercholesterolemia    NO MEDS   Prostate cancer [facility])     Past Surgical History:  Procedure Laterality Date   APPENDECTOMY     CATARACT EXTRACTION     right- 01-19-22, left 02-02-22   COLONOSCOPY     LEG WOUND REPAIR / CLOSURE Left 12/11/1998   INJURY   PROSTATE BIOPSY     PROSTATE SURGERY     RADIOACTIVE SEED IMPLANT N/A 05/29/2013   Procedure: RADIOACTIVE SEED IMPLANT;  Surgeon: Franchot Gallo, MD;  Location: [facility];  Service: Urology;  Laterality: N/A;   TONSILLECTOMY AND ADENOIDECTOMY  AGE 55   VASECTOMY      Social History   Tobacco Use   Smoking status: Never   Smokeless tobacco: Never  Vaping Use    Vaping Use: Never used  Substance Use Topics   Alcohol use: Yes    Alcohol/week: 6.0 standard drinks of alcohol    Types: 4 Glasses of wine, 2 Cans of beer per week    Comment: 5-6 drinks   Drug use: No    Family History  Problem Relation Age of Onset   Dementia Mother    Prostate cancer Father    Prostate cancer Brother    Cancer Brother    Hyperlipidemia Brother    Cancer Brother    Hyperlipidemia Brother    Sudden death Neg Hx    Hypertension Neg Hx    Heart attack Neg Hx    Diabetes Neg Hx    Colon cancer Neg Hx    Esophageal cancer Neg Hx    Stomach cancer Neg Hx    Rectal cancer Neg Hx     No Known Allergies  Medication list has been reviewed and updated.  Current Outpatient Medications on File Prior to Visit  Medication Sig Dispense Refill   methocarbamol (ROBAXIN) 500 MG tablet Take 1 tablet (500 mg total) by mouth every 8 (eight) hours as needed for muscle spasms. 30 tablet 1   No current facility-administered medications on file prior to visit.    Review of Systems:  As per  HPI- otherwise negative.   Physical Examination: There were no vitals filed for this visit. There were no vitals filed for this visit. There is no height or weight on file to calculate BMI. Ideal Body Weight:    GEN: no acute distress. HEENT: Atraumatic, Normocephalic.  Ears and Nose: No external deformity. CV: RRR, No M/G/R. No JVD. No thrill. No extra heart sounds. PULM: CTA B, no wheezes, crackles, rhonchi. No retractions. No resp. distress. No accessory muscle use. ABD: S, NT, ND, +BS. No rebound. No HSM. EXTR: No c/c/e PSYCH: Normally interactive. Conversant.    Assessment and Plan: ***  Signed Lamar Blinks, MD

## 2022-08-24 NOTE — Patient Instructions (Incomplete)
It was great to see you again today, I will be in touch with your labs Suggest COVID-19 booster, RSV vaccine this fall

## 2022-08-28 ENCOUNTER — Ambulatory Visit (INDEPENDENT_AMBULATORY_CARE_PROVIDER_SITE_OTHER): Payer: PPO | Admitting: Family Medicine

## 2022-08-28 ENCOUNTER — Encounter: Payer: Self-pay | Admitting: Family Medicine

## 2022-08-28 VITALS — BP 110/60 | HR 53 | Temp 97.7°F | Resp 18 | Ht 71.0 in | Wt 169.0 lb

## 2022-08-28 DIAGNOSIS — E785 Hyperlipidemia, unspecified: Secondary | ICD-10-CM

## 2022-08-28 DIAGNOSIS — Z8546 Personal history of malignant neoplasm of prostate: Secondary | ICD-10-CM

## 2022-08-28 DIAGNOSIS — Z130 Encounter for screening for diseases of the blood and blood-forming organs and certain disorders involving the immune mechanism: Secondary | ICD-10-CM | POA: Diagnosis not present

## 2022-08-28 DIAGNOSIS — R7303 Prediabetes: Secondary | ICD-10-CM | POA: Diagnosis not present

## 2022-08-28 LAB — LIPID PANEL
Cholesterol: 208 mg/dL — ABNORMAL HIGH (ref 0–200)
HDL: 54.5 mg/dL (ref >39.00)
LDL Cholesterol: 137 mg/dL — ABNORMAL HIGH (ref 0–99)
NonHDL: 153.31
Total CHOL/HDL Ratio: 4
Triglycerides: 82 mg/dL (ref 0.0–149.0)
VLDL: 16.4 mg/dL (ref 0.0–40.0)

## 2022-08-28 LAB — CBC
HCT: 39.3 % (ref 39.0–52.0)
Hemoglobin: 13.2 g/dL (ref 13.0–17.0)
MCHC: 33.6 g/dL (ref 30.0–36.0)
MCV: 91.1 fl (ref 78.0–100.0)
Platelets: 250 10*3/uL (ref 150.0–400.0)
RBC: 4.32 Mil/uL (ref 4.22–5.81)
RDW: 13.2 % (ref 11.5–15.5)
WBC: 4.1 10*3/uL (ref 4.0–10.5)

## 2022-08-28 LAB — COMPREHENSIVE METABOLIC PANEL
ALT: 26 U/L (ref 0–53)
AST: 24 U/L (ref 0–37)
Albumin: 4.1 g/dL (ref 3.5–5.2)
Alkaline Phosphatase: 104 U/L (ref 39–117)
BUN: 17 mg/dL (ref 6–23)
CO2: 28 mEq/L (ref 19–32)
Calcium: 9.6 mg/dL (ref 8.4–10.5)
Chloride: 100 mEq/L (ref 96–112)
Creatinine, Ser: 1.09 mg/dL (ref 0.40–1.50)
GFR: 64.19 mL/min (ref >60.00)
Glucose, Bld: 88 mg/dL (ref 70–99)
Potassium: 4.3 mEq/L (ref 3.5–5.1)
Sodium: 137 mEq/L (ref 135–145)
Total Bilirubin: 0.7 mg/dL (ref 0.2–1.2)
Total Protein: 7 g/dL (ref 6.0–8.3)

## 2022-08-28 LAB — PSA: PSA: 0 ng/mL — ABNORMAL LOW (ref 0.10–4.00)

## 2022-08-28 LAB — HEMOGLOBIN A1C: Hgb A1c MFr Bld: 5.8 % (ref 4.6–6.5)

## 2022-09-28 IMAGING — DX DG CHEST 2V
2 series · 2 of 2 positions shown · non-contrast
Comparison: 04/10/2013 chest radiograph.

CLINICAL DATA: Nonproductive cough for 2 weeks

EXAM:
CHEST - 2 VIEW

[chest pa]
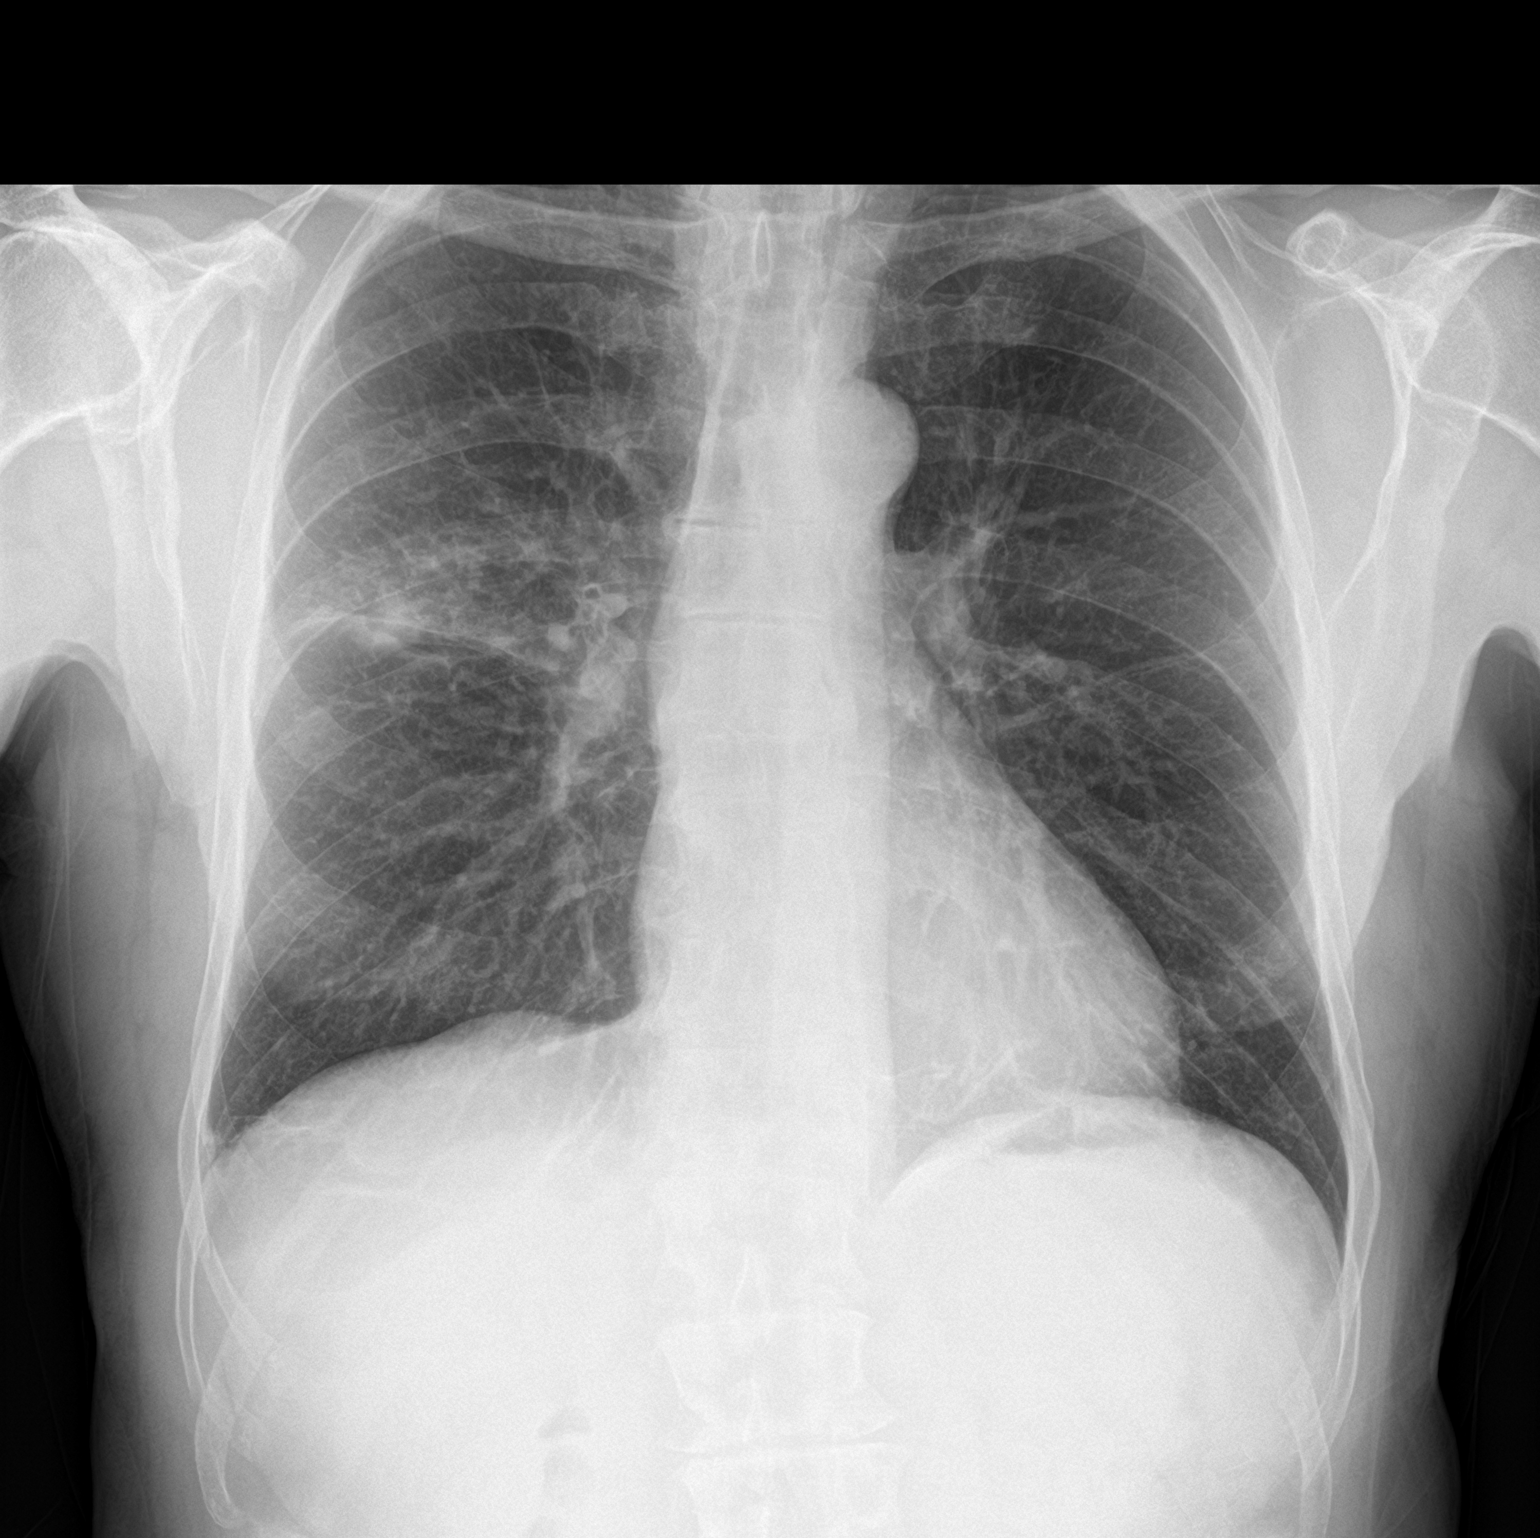

[chest lat]
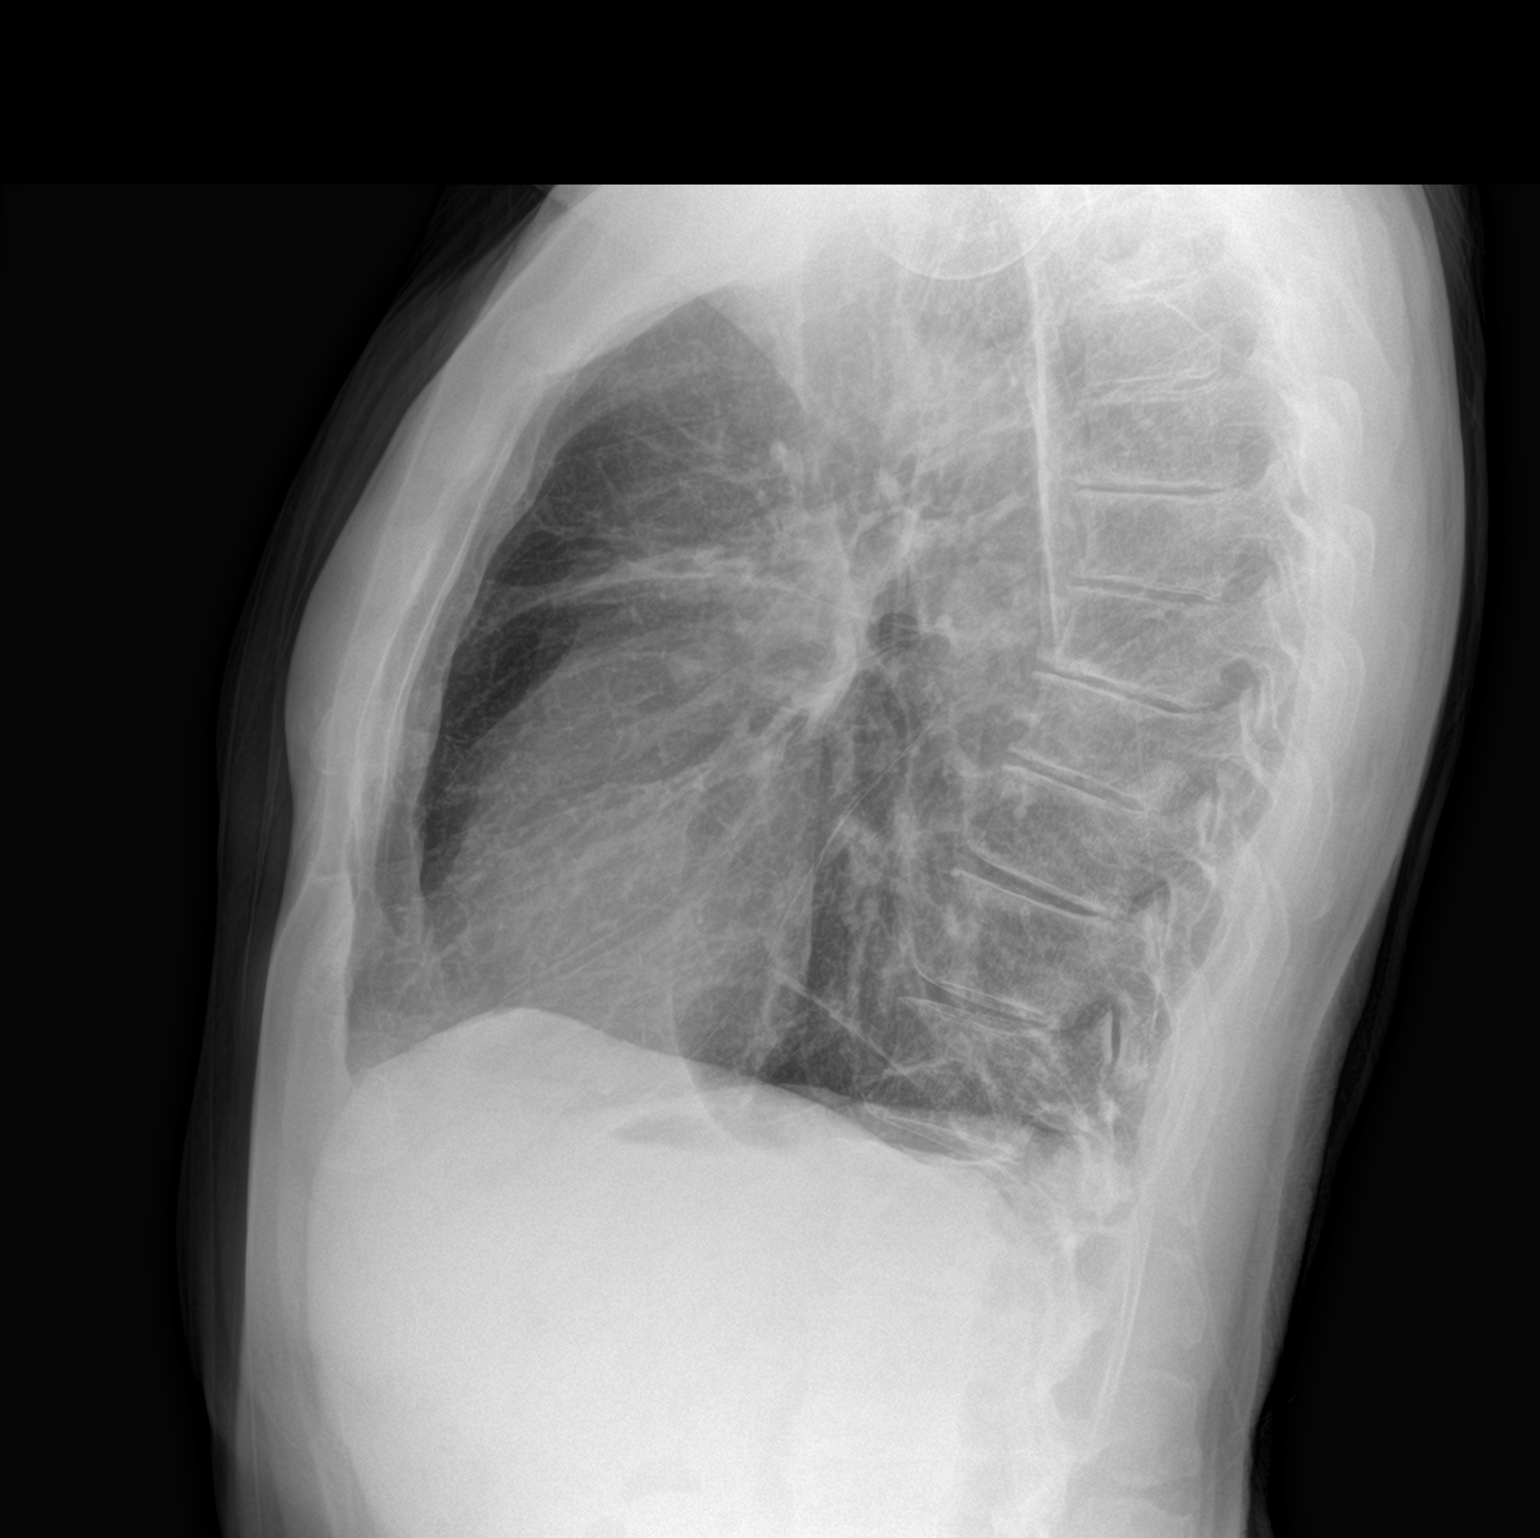

[2 of 2 positions shown; findings below may reference images not displayed]

FINDINGS: Stable cardiomediastinal silhouette with normal heart size. No
pneumothorax. No pleural effusion. Patchy anterior right mid lung
opacity, new. Otherwise clear lungs.
IMPRESSION: New patchy anterior right mid lung opacity, suspicious for
pneumonia. Recommend follow-up PA and lateral post treatment chest
radiographs in 4-6 weeks.

## 2022-10-02 DIAGNOSIS — L821 Other seborrheic keratosis: Secondary | ICD-10-CM | POA: Diagnosis not present

## 2022-10-02 DIAGNOSIS — Z85828 Personal history of other malignant neoplasm of skin: Secondary | ICD-10-CM | POA: Diagnosis not present

## 2022-10-02 DIAGNOSIS — C4441 Basal cell carcinoma of skin of scalp and neck: Secondary | ICD-10-CM | POA: Diagnosis not present

## 2022-10-02 DIAGNOSIS — L570 Actinic keratosis: Secondary | ICD-10-CM | POA: Diagnosis not present

## 2022-10-02 DIAGNOSIS — L814 Other melanin hyperpigmentation: Secondary | ICD-10-CM | POA: Diagnosis not present

## 2022-10-02 DIAGNOSIS — L820 Inflamed seborrheic keratosis: Secondary | ICD-10-CM | POA: Diagnosis not present

## 2022-10-12 ENCOUNTER — Encounter: Payer: Self-pay | Admitting: Family Medicine

## 2022-10-12 DIAGNOSIS — C4490 Unspecified malignant neoplasm of skin, unspecified: Secondary | ICD-10-CM | POA: Insufficient documentation

## 2022-12-18 DIAGNOSIS — N5201 Erectile dysfunction due to arterial insufficiency: Secondary | ICD-10-CM | POA: Diagnosis not present

## 2022-12-18 DIAGNOSIS — R3121 Asymptomatic microscopic hematuria: Secondary | ICD-10-CM | POA: Diagnosis not present

## 2022-12-18 DIAGNOSIS — Z8546 Personal history of malignant neoplasm of prostate: Secondary | ICD-10-CM | POA: Diagnosis not present

## 2023-01-03 DIAGNOSIS — B353 Tinea pedis: Secondary | ICD-10-CM | POA: Diagnosis not present

## 2023-01-03 DIAGNOSIS — L814 Other melanin hyperpigmentation: Secondary | ICD-10-CM | POA: Diagnosis not present

## 2023-01-03 DIAGNOSIS — Z85828 Personal history of other malignant neoplasm of skin: Secondary | ICD-10-CM | POA: Diagnosis not present

## 2023-01-03 DIAGNOSIS — L821 Other seborrheic keratosis: Secondary | ICD-10-CM | POA: Diagnosis not present

## 2023-02-14 NOTE — Progress Notes (Signed)
[place] at [facility] [address] [place], [place] [address] [phone]  Date:  02/26/2023   Name:  Parker Harrison   DOB:  December 22, 1940   MRN:  [id number]  PCP:  Darreld Mclean, MD    Chief Complaint: 6 month follow up (Concerns/ questions: none/AWV due)   History of Present Illness:  Parker Harrison is a 82 y.o. very pleasant male patient who presents with the following:  Patient seen today for periodic follow-up Most recent visit with myself was in September - history of hyperlipidemia, prostate cancer, quad rupture, hearing loss, pre-diabetes but most recent A1c normal We have taken over his PSA surveillance; he has been released by urology.  History of prostatectomy, expected PSA of 0  Most recent labs done in September-CMP, lipid, CBC, A1c 5 point percent, PSA 0  Never smoker, moderate alcohol  Can offer CT coronary-he is interested, will order for him today  He is playing a lot of tennis still; no chest pain or shortness of breath with significant exercise He had not had a covid booster in a year or so- recommended that he do this   Lab Results  Component Value Date   PSA 0.00 (L) 08/28/2022   PSA 0.00 Repeated and verified X2. (L) 08/04/2021   PSA <0.1 08/04/2020      Patient Active Problem List   Diagnosis Date Noted   Skin cancer 10/12/2022   Pre-diabetes 08/01/2020   Hearing loss 05/25/2015   Adenocarcinoma of prostate ([place]) 02/18/2013   Hypercholesteremia 11/24/2012   Personal history of colonic polyps 11/22/2012   Quadriceps tendon rupture 08/16/2011   Tear of meniscus, knee, medial 07/20/2011    Past Medical History:  Diagnosis Date   Adenomatous polyp of colon    Cataract    Cataract    bilateral   Hearing aid worn    BILATERAL   Hypercholesterolemia    NO MEDS   Prostate cancer [facility])     Past Surgical History:  Procedure Laterality Date   APPENDECTOMY     CATARACT EXTRACTION      right- 01-19-22, left 02-02-22   COLONOSCOPY     LEG WOUND REPAIR / CLOSURE Left 12/11/1998   INJURY   PROSTATE BIOPSY     PROSTATE SURGERY     RADIOACTIVE SEED IMPLANT N/A 05/29/2013   Procedure: RADIOACTIVE SEED IMPLANT;  Surgeon: Franchot Gallo, MD;  Location[facility];  Service: Urology;  Laterality: N/A;   TONSILLECTOMY AND ADENOIDECTOMY  AGE 61   VASECTOMY      Social History   Tobacco Use   Smoking status: Never   Smokeless tobacco: Never  Vaping Use   Vaping Use: Never used  Substance Use Topics   Alcohol use: Yes    Alcohol/week: 6.0 standard drinks of alcohol    Types: 4 Glasses of wine, 2 Cans of beer per week    Comment: 5-6 drinks   Drug use: No    Family History  Problem Relation Age of Onset   Dementia Mother    Prostate cancer Father    Prostate cancer Brother    Cancer Brother    Hyperlipidemia Brother    Cancer Brother    Hyperlipidemia Brother    Sudden death Neg Hx    Hypertension Neg Hx    Heart attack Neg Hx    Diabetes Neg Hx    Colon cancer Neg Hx  Esophageal cancer Neg Hx    Stomach cancer Neg Hx    Rectal cancer Neg Hx     No Known Allergies  Medication list has been reviewed and updated.  No current outpatient medications on file prior to visit.   No current facility-administered medications on file prior to visit.    Review of Systems:  As per HPI- otherwise negative.   Physical Examination: Vitals:   02/26/23 0841  BP: 110/60  Pulse: (!) 51  Resp: 18  Temp: 97.6 F (36.4 C)  SpO2: 99%   Vitals:   02/26/23 0841  Weight: 175 lb (79.4 kg)  Height: 5\' 11"  (1.803 m)   Body mass index is 24.41 kg/m. Ideal Body Weight: Weight in (lb) to have BMI = 25: 178.9  GEN: no acute distress. Normal weight, looks well  HEENT: Atraumatic, Normocephalic.  Ears and Nose: No external deformity. CV: RRR, No M/G/R. No JVD. No thrill. No extra heart sounds. PULM: CTA B, no wheezes, crackles, rhonchi. No  retractions. No resp. distress. No accessory muscle use. ABD: S, NT, ND EXTR: No c/c/e PSYCH: Normally interactive. Conversant.   Pulse Readings from Last 3 Encounters:  02/26/23 (!) 51  08/28/22 (!) 53  08/21/22 68    Assessment and Plan: Dyslipidemia - Plan: CT CARDIAC SCORING (SELF PAY ONLY)  Pre-diabetes - Plan: Basic metabolic panel, Hemoglobin A1c  History of prostate cancer  Seen today for follow-up He is interested in doing a coronary calcium- will order now Labs pending as above Continue healthy lifestyle Pt has seen his urologist who confirmed he needs annual PSA Recheck in 6 months   Lab Results  Component Value Date   PSA 0.00 (L) 08/28/2022   PSA 0.00 Repeated and verified X2. (L) 08/04/2021   PSA <0.1 08/04/2020     Signed Lamar Blinks, MD  Received labs as below, message to patient Results for orders placed or performed in visit on 16/10/96  Basic metabolic panel  Result Value Ref Range   Sodium 136 135 - 145 mEq/L   Potassium 4.4 3.5 - 5.1 mEq/L   Chloride 100 96 - 112 mEq/L   CO2 29 19 - 32 mEq/L   Glucose, Bld 88 70 - 99 mg/dL   BUN 19 6 - 23 mg/dL   Creatinine, Ser 1.09 0.40 - 1.50 mg/dL   GFR 63.96 >60.00 mL/min   Calcium 9.6 8.4 - 10.5 mg/dL  Hemoglobin A1c  Result Value Ref Range   Hgb A1c MFr Bld 5.8 4.6 - 6.5 %

## 2023-02-14 NOTE — Patient Instructions (Addendum)
Good to see you again today Recommend COVID booster if not done in the last 6 months or so I will be in touch with your labs Stop by imaging on the ground floor to get or schedule your CT coronary scan See me in 6 months assuming all is well

## 2023-02-26 ENCOUNTER — Other Ambulatory Visit (HOSPITAL_BASED_OUTPATIENT_CLINIC_OR_DEPARTMENT_OTHER): Payer: Self-pay

## 2023-02-26 ENCOUNTER — Encounter: Payer: Self-pay | Admitting: Family Medicine

## 2023-02-26 ENCOUNTER — Ambulatory Visit (HOSPITAL_BASED_OUTPATIENT_CLINIC_OR_DEPARTMENT_OTHER)
Admission: RE | Admit: 2023-02-26 | Discharge: 2023-02-26 | Disposition: A | Discharge status: Home / Self Care | Payer: PPO | Source: Ambulatory Visit | Attending: Family Medicine | Admitting: Family Medicine

## 2023-02-26 ENCOUNTER — Ambulatory Visit (INDEPENDENT_AMBULATORY_CARE_PROVIDER_SITE_OTHER): Payer: PPO | Admitting: Family Medicine

## 2023-02-26 VITALS — BP 110/60 | HR 51 | Temp 97.6°F | Resp 18 | Ht 71.0 in | Wt 175.0 lb

## 2023-02-26 DIAGNOSIS — E785 Hyperlipidemia, unspecified: Secondary | ICD-10-CM | POA: Diagnosis not present

## 2023-02-26 DIAGNOSIS — Z8546 Personal history of malignant neoplasm of prostate: Secondary | ICD-10-CM | POA: Diagnosis not present

## 2023-02-26 DIAGNOSIS — R7303 Prediabetes: Secondary | ICD-10-CM

## 2023-02-26 LAB — BASIC METABOLIC PANEL
BUN: 19 mg/dL (ref 6–23)
CO2: 29 mEq/L (ref 19–32)
Calcium: 9.6 mg/dL (ref 8.4–10.5)
Chloride: 100 mEq/L (ref 96–112)
Creatinine, Ser: 1.09 mg/dL (ref 0.40–1.50)
GFR: 63.96 mL/min (ref >60.00)
Glucose, Bld: 88 mg/dL (ref 70–99)
Potassium: 4.4 mEq/L (ref 3.5–5.1)
Sodium: 136 mEq/L (ref 135–145)

## 2023-02-26 LAB — HEMOGLOBIN A1C: Hgb A1c MFr Bld: 5.8 % (ref 4.6–6.5)

## 2023-02-26 MED ORDER — COMIRNATY 30 MCG/0.3ML IM SUSY
PREFILLED_SYRINGE | INTRAMUSCULAR | 0 refills | Status: DC
Start: 2023-02-26 — End: 2023-04-02
  Filled 2023-02-26: qty 0.3, 1d supply, fill #0

## 2023-02-27 ENCOUNTER — Encounter: Payer: Self-pay | Admitting: Family Medicine

## 2023-02-27 ENCOUNTER — Other Ambulatory Visit: Payer: Self-pay | Admitting: Family Medicine

## 2023-02-27 DIAGNOSIS — R931 Abnormal findings on diagnostic imaging of heart and coronary circulation: Secondary | ICD-10-CM

## 2023-02-27 DIAGNOSIS — R918 Other nonspecific abnormal finding of lung field: Secondary | ICD-10-CM

## 2023-02-27 DIAGNOSIS — I7121 Aneurysm of the ascending aorta, without rupture: Secondary | ICD-10-CM

## 2023-03-07 ENCOUNTER — Ambulatory Visit
Admission: RE | Admit: 2023-03-07 | Discharge: 2023-03-07 | Disposition: A | Discharge status: Home / Self Care | Payer: PPO | Source: Ambulatory Visit | Attending: Family Medicine | Admitting: Family Medicine

## 2023-03-07 DIAGNOSIS — R918 Other nonspecific abnormal finding of lung field: Secondary | ICD-10-CM | POA: Diagnosis not present

## 2023-03-07 DIAGNOSIS — R911 Solitary pulmonary nodule: Secondary | ICD-10-CM | POA: Diagnosis not present

## 2023-03-07 DIAGNOSIS — I700 Atherosclerosis of aorta: Secondary | ICD-10-CM | POA: Diagnosis not present

## 2023-03-07 DIAGNOSIS — J9811 Atelectasis: Secondary | ICD-10-CM | POA: Diagnosis not present

## 2023-03-10 ENCOUNTER — Encounter: Payer: Self-pay | Admitting: Family Medicine

## 2023-03-10 DIAGNOSIS — R918 Other nonspecific abnormal finding of lung field: Secondary | ICD-10-CM | POA: Insufficient documentation

## 2023-03-21 DIAGNOSIS — Z961 Presence of intraocular lens: Secondary | ICD-10-CM | POA: Diagnosis not present

## 2023-03-21 DIAGNOSIS — H52203 Unspecified astigmatism, bilateral: Secondary | ICD-10-CM | POA: Diagnosis not present

## 2023-03-30 NOTE — Progress Notes (Addendum)
[address]       [place] [address]             [phone]        Parker Harrison [id number] May 10, 1941   History of Present Illness:  Parker Harrison is an 82 yo male with history of Prostate Cancer S/p Prostatectomy, HLD, Hearing loss and Pre-diabetes.  He recently presented to his family medicine doctor for routine follow up.  She offered the patient a Cardiac CT scan which patient decided to proceed.  This was performed on 3/18 and showed an incidental finding of a 4.8 cm Ascending Aortic Aneurysm.  Due to this additional workup with CTA chest this showed severe coronary artery disease, scattered benign pleural nodules, and confirmed an Ascending Aortic Aneurysm at 4.7 cm.  He has been sent to establish surveillance.    Current Outpatient Medications on File Prior to Visit  Medication Sig Dispense Refill   COVID-19 mRNA vaccine 2023-2024 (COMIRNATY) syringe Inject into the muscle. 0.3 mL 0   No current facility-administered medications on file prior to visit.     There were no vitals taken for this visit.  Physical Exam    CTA Results:  EXAM: CT CHEST WITHOUT CONTRAST   TECHNIQUE: Multidetector CT imaging of the chest was performed following the standard protocol without IV contrast.   RADIATION DOSE REDUCTION: This exam was performed according to the departmental dose-optimization program which includes automated exposure control, adjustment of the mA and/or kV according to patient size and/or use of iterative reconstruction technique.   COMPARISON:  02/26/2023   FINDINGS: Cardiovascular: Heart size is normal. No pericardial effusion. Mid ascending thoracic aorta measures approximately 4.7 cm in diameter. Mild aortic atherosclerosis. Severe three-vessel coronary artery atherosclerosis. Central pulmonary vasculature is nondilated.   Mediastinum/Nodes: No enlarged mediastinal or axillary lymph nodes. Thyroid gland, trachea, and esophagus  demonstrate no significant findings.   Lungs/Pleura: There are a few small noncalcified pulmonary nodules in the right middle lobe including 4 mm nodule (series 8, image 119). No additional pulmonary nodules or masses. Mild bibasilar subsegmental atelectasis. No focal airspace consolidation. No pleural effusion or pneumothorax.   Upper Abdomen: Bilateral nephrolithiasis.   Musculoskeletal: No chest wall mass or suspicious bone lesions identified. Thoracic spondylosis. Intraosseous hemangioma noted in the T6 vertebral body.   IMPRESSION: 1. There are a few small noncalcified pulmonary nodules in the right middle lobe including 4 mm nodule. No follow-up needed if patient is low-risk (and has no known or suspected primary neoplasm). Non-contrast chest CT can be considered in 12 months if patient is high-risk. This recommendation follows the consensus statement: Guidelines for Management of Incidental Pulmonary Nodules Detected on CT Images: From the Fleischner Society 2017; Radiology 2017; 284:228-243. 2. Severe three-vessel coronary artery atherosclerosis. 3. Bilateral nephrolithiasis.   Aortic Atherosclerosis (ICD10-I70.0).     Electronically Signed   By: Duanne Guess D.O.   On: 03/09/2023 11:03    A/P:  Ascending Aortic Aneurysm- measuring 4.7 cm Severe 3V Coronary Artery disease- evaluated by Dr. Antoine Poche on Monday 4/22 HLD    Risk Modification:  Statin:  ***  Smoking cessation instruction/counseling given:  {CHL AMB [facility] SMOKING CESSATION COUNSELING:20758}  Patient was counseled on importance of Blood Pressure Control.  Despite Medical intervention if the patient notices persistently elevated blood pressure readings.  They are instructed to contact their Primary Care Physician  Please avoid use of Fluoroquinolones as this can potentially increase your risk of Aortic  Rupture and/or Dissection  Patient educated on signs and symptoms of Aortic Dissection,  handout also provided in AVS  Erin Barrett, PA-C 03/30/23

## 2023-04-01 DIAGNOSIS — I77819 Aortic ectasia, unspecified site: Secondary | ICD-10-CM | POA: Insufficient documentation

## 2023-04-01 DIAGNOSIS — R931 Abnormal findings on diagnostic imaging of heart and coronary circulation: Secondary | ICD-10-CM | POA: Insufficient documentation

## 2023-04-01 NOTE — Progress Notes (Signed)
Cardiology Office Note   Date:  04/02/2023   ID:  Parker Harrison, DOB 26-Apr-1941, MRN [id number]  PCP:  Pearline Cables, MD  Cardiologist:   None Referring:  [name], Gwenlyn Found, MD  Chief Complaint  Patient presents with   Coronary Artery Disease     History of Present Illness: Parker Harrison is a 82 y.o. male who presents for for evaluation of elevated coronary calcium.  This was noted on a CT.  He subsequently had a coronary calcium score of 3049 or 90th percentile.  He has not had any prior cardiac history other than wearing a monitor decades ago.  He is being followed before a 47 mm aorta.  He sees Dr. Dorris Fetch soon.  The patient denies any new symptoms such as chest discomfort, neck or arm discomfort. There has been no new shortness of breath, PND or orthopnea. There have been no reported palpitations, presyncope or syncope.  He is very athletic and still plays doubles tennis, walks 4 miles he does [employer] and rarely swims.   Past Medical History:  Diagnosis Date   Adenomatous polyp of colon    Cataract    Cataract    bilateral   Hearing aid worn    BILATERAL   Hypercholesterolemia    NO MEDS   Prostate cancer     Past Surgical History:  Procedure Laterality Date   APPENDECTOMY     CATARACT EXTRACTION     right- 01-19-22, left 02-02-22   COLONOSCOPY     LEG WOUND REPAIR / CLOSURE Left 12/11/1998   INJURY   PROSTATE BIOPSY     PROSTATE SURGERY     RADIOACTIVE SEED IMPLANT N/A 05/29/2013   Procedure: RADIOACTIVE SEED IMPLANT;  Surgeon: Marcine Matar, MD;  Location: [facility];  Service: Urology;  Laterality: N/A;   TONSILLECTOMY AND ADENOIDECTOMY  AGE 32   VASECTOMY       Current Outpatient Medications  Medication Sig Dispense Refill   rosuvastatin (CRESTOR) 20 MG tablet Take 1 tablet (20 mg total) by mouth daily. 90 tablet 3   No current facility-administered medications for this visit.    Allergies:   Patient has no known  allergies.    Social History:  The patient  reports that he has never smoked. He has never used smokeless tobacco. He reports current alcohol use of about 6.0 standard drinks of alcohol per week. He reports that he does not use drugs.   Family History:  The patient's family history includes Cancer in his brother and brother; Dementia in his mother; Hyperlipidemia in his brother and brother; Prostate cancer in his brother and father.    ROS:  Please see the history of present illness.   Otherwise, review of systems are positive for none.   All other systems are reviewed and negative.    PHYSICAL EXAM: VS:  BP 138/66   Pulse (!) 57   Ht 5\' 11"  (1.803 m)   Wt 177 lb 12.8 oz (80.6 kg)   SpO2 99%   BMI 24.80 kg/m  , BMI Body mass index is 24.8 kg/m. GENERAL:  Well appearing HEENT:  Pupils equal round and reactive, fundi not visualized, oral mucosa unremarkable NECK:  No jugular venous distention, waveform within normal limits, carotid upstroke brisk and symmetric, no bruits, no thyromegaly LYMPHATICS:  No cervical, inguinal adenopathy LUNGS:  Clear to auscultation bilaterally BACK:  No CVA tenderness CHEST:  Unremarkable HEART:  PMI not displaced or sustained,S1  and S2 within normal limits, no S3, no S4, no clicks, no rubs, no murmurs ABD:  Flat, positive bowel sounds normal in frequency in pitch, no bruits, no rebound, no guarding, no midline pulsatile mass, no hepatomegaly, no splenomegaly EXT:  2 plus pulses throughout, no edema, no cyanosis no clubbing SKIN:  No rashes no nodules NEURO:  Cranial nerves II through XII grossly intact, motor grossly intact throughout PSYCH:  Cognitively intact, oriented to person place and time    EKG:  EKG is ordered today. The ekg ordered today demonstrates sinus rhythm, rate 57, axis within normal limits, intervals within normal limits, no acute ST-T wave changes.   Recent Labs: 08/28/2022: ALT 26; Hemoglobin 13.2; Platelets 250.0 02/26/2023:  BUN 19; Creatinine, Ser 1.09; Potassium 4.4; Sodium 136    Lipid Panel    Component Value Date/Time   CHOL 208 (H) 08/28/2022 0911   TRIG 82.0 08/28/2022 0911   HDL 54.50 08/28/2022 0911   CHOLHDL 4 08/28/2022 0911   VLDL 16.4 08/28/2022 0911   LDLCALC 137 (H) 08/28/2022 0911   LDLCALC 161 (H) 08/04/2020 0851      Wt Readings from Last 3 Encounters:  04/02/23 177 lb 12.8 oz (80.6 kg)  02/26/23 175 lb (79.4 kg)  08/28/22 169 lb (76.7 kg)      Other studies Reviewed: Additional studies/ records that were reviewed today include: Labs. Review of the above records demonstrates:  Please see elsewhere in the note.     ASSESSMENT AND PLAN:  Elevated coronary calcium: Given this he has some degree likely of intraluminal coronary disease. POET (Plain Old Exercise Treadmill).  He has no unstable symptoms.  We are going to participate in primary risk reduction.  Enlarged ascending aorta: He is following with Dr. Dorris Fetch.  This actually measured at 47 mm.  Lung nodule: This be assessed also by Dr. .  Dyslipidemia: I think at target should be an LDL in the 50s.  He agrees to start Crestor 20 mg daily.  I will check a lipid profile and LP(a) in 3 months.  Of note we did discuss aspirin and risk scoring would suggest aspirin but I think the patient preference is to avoid that given the risk of bleed.   Current medicines are reviewed at length with the patient today.  The patient does not have concerns regarding medicines.  The following changes have been made: As above  Labs/ tests ordered today include:   Orders Placed This Encounter  Procedures   Lipid panel   Lipoprotein A (LPA)   EXERCISE TOLERANCE TEST (ETT)   EKG 12-Lead     Disposition:   FU with me in 1 year or sooner based on the results of the above.   Signed, Rollene Rotunda, MD  04/02/2023 10:28 AM    [facility]

## 2023-04-02 ENCOUNTER — Encounter: Payer: Self-pay | Admitting: Cardiology

## 2023-04-02 ENCOUNTER — Ambulatory Visit: Payer: PPO | Attending: Cardiology | Admitting: Cardiology

## 2023-04-02 VITALS — BP 138/66 | HR 57 | Ht 71.0 in | Wt 177.8 lb

## 2023-04-02 DIAGNOSIS — R918 Other nonspecific abnormal finding of lung field: Secondary | ICD-10-CM | POA: Diagnosis not present

## 2023-04-02 DIAGNOSIS — R931 Abnormal findings on diagnostic imaging of heart and coronary circulation: Secondary | ICD-10-CM | POA: Diagnosis not present

## 2023-04-02 DIAGNOSIS — I77819 Aortic ectasia, unspecified site: Secondary | ICD-10-CM | POA: Diagnosis not present

## 2023-04-02 MED ORDER — ROSUVASTATIN CALCIUM 20 MG PO TABS
20.0000 mg | ORAL_TABLET | Freq: Every day | ORAL | 3 refills | Status: DC
Start: 2023-04-02 — End: 2023-08-07

## 2023-04-02 NOTE — Patient Instructions (Signed)
Medication Instructions:  Your physician has recommended you make the following change in your medication:   -Start taking rosuvastatin (crestor)  once daily.  *If you need a refill on your cardiac medications before your next appointment, please call your pharmacy*   Lab Work: Your physician recommends that you return for lab work in: 3 months for FASTING Lipid & LPa  If you have labs (blood work) drawn today and your tests are completely normal, you will receive your results only by: MyChart Message (if you have MyChart) OR A paper copy in the mail If you have any lab test that is abnormal or we need to change your treatment, we will call you to review the results.   Testing/Procedures: Your physician has requested that you have an exercise tolerance test.  Please also follow instruction sheet, as given. This will take place at [address], [address] Do not drink or eat foods with caffeine for 24 hours before the test. (Chocolate, coffee, tea, or energy drinks) If you use an inhaler, bring it with you to the test. Do not smoke for 4 hours before the test. Wear comfortable shoes and clothing.    Follow-Up: At [facility], you and your health needs are our priority.  As part of our continuing mission to provide you with exceptional heart care, we have created designated Provider Care Teams.  These Care Teams include your primary Cardiologist (physician) and Advanced Practice Providers (APPs -  Physician Assistants and Nurse Practitioners) who all work together to provide you with the care you need, when you need it.  We recommend signing up for the patient portal called "MyChart".  Sign up information is provided on this After Visit Summary.  MyChart is used to connect with patients for Virtual Visits (Telemedicine).  Patients are able to view lab/test results, encounter notes, upcoming appointments, etc.  Non-urgent messages can be sent to your provider as well.   To  learn more about what you can do with MyChart, go to [email or web address].    Your next appointment:   12 month(s)  Provider:   Rollene Rotunda, MD

## 2023-04-04 ENCOUNTER — Institutional Professional Consult (permissible substitution): Payer: PPO | Admitting: Surgical

## 2023-04-04 VITALS — BP 124/69 | HR 58 | Resp 20 | Ht 71.0 in | Wt 170.0 lb

## 2023-04-04 DIAGNOSIS — I7121 Aneurysm of the ascending aorta, without rupture: Secondary | ICD-10-CM

## 2023-04-04 NOTE — Patient Instructions (Signed)
Control of medical diagnoses management as well as lifestyle management-as discussed

## 2023-04-11 ENCOUNTER — Encounter (HOSPITAL_COMMUNITY): Payer: Self-pay | Admitting: *Deleted

## 2023-04-18 ENCOUNTER — Ambulatory Visit (HOSPITAL_COMMUNITY): Payer: PPO | Attending: Cardiology

## 2023-04-18 DIAGNOSIS — I77819 Aortic ectasia, unspecified site: Secondary | ICD-10-CM | POA: Insufficient documentation

## 2023-04-18 DIAGNOSIS — R918 Other nonspecific abnormal finding of lung field: Secondary | ICD-10-CM | POA: Diagnosis not present

## 2023-04-18 DIAGNOSIS — R931 Abnormal findings on diagnostic imaging of heart and coronary circulation: Secondary | ICD-10-CM | POA: Insufficient documentation

## 2023-04-18 LAB — EXERCISE TOLERANCE TEST
Angina Index: 0
Duke Treadmill Score: 12
Estimated workload: 13.4
Exercise duration (min): 12 min
Exercise duration (sec): 0 s
MPHR: 140 {beats}/min
Peak HR: 148 {beats}/min
Percent HR: 105 %
Rest HR: 59 {beats}/min
ST Depression (mm): 0 mm

## 2023-07-18 DIAGNOSIS — Z85828 Personal history of other malignant neoplasm of skin: Secondary | ICD-10-CM | POA: Diagnosis not present

## 2023-07-18 DIAGNOSIS — L84 Corns and callosities: Secondary | ICD-10-CM | POA: Diagnosis not present

## 2023-07-18 DIAGNOSIS — C4441 Basal cell carcinoma of skin of scalp and neck: Secondary | ICD-10-CM | POA: Diagnosis not present

## 2023-07-18 DIAGNOSIS — L570 Actinic keratosis: Secondary | ICD-10-CM | POA: Diagnosis not present

## 2023-07-18 DIAGNOSIS — L821 Other seborrheic keratosis: Secondary | ICD-10-CM | POA: Diagnosis not present

## 2023-07-24 ENCOUNTER — Encounter: Payer: Self-pay | Admitting: *Deleted

## 2023-07-26 ENCOUNTER — Encounter (INDEPENDENT_AMBULATORY_CARE_PROVIDER_SITE_OTHER): Payer: Self-pay

## 2023-07-30 DIAGNOSIS — Z85828 Personal history of other malignant neoplasm of skin: Secondary | ICD-10-CM | POA: Diagnosis not present

## 2023-07-30 DIAGNOSIS — I77819 Aortic ectasia, unspecified site: Secondary | ICD-10-CM | POA: Diagnosis not present

## 2023-07-30 DIAGNOSIS — R918 Other nonspecific abnormal finding of lung field: Secondary | ICD-10-CM | POA: Diagnosis not present

## 2023-07-30 DIAGNOSIS — R931 Abnormal findings on diagnostic imaging of heart and coronary circulation: Secondary | ICD-10-CM | POA: Diagnosis not present

## 2023-07-30 DIAGNOSIS — C4441 Basal cell carcinoma of skin of scalp and neck: Secondary | ICD-10-CM | POA: Diagnosis not present

## 2023-07-31 LAB — LIPID PANEL
Chol/HDL Ratio: 2.7 ratio (ref 0.0–5.0)
Triglycerides: 67 mg/dL (ref 0–149)

## 2023-08-07 ENCOUNTER — Encounter: Payer: Self-pay | Admitting: Cardiology

## 2023-08-07 ENCOUNTER — Other Ambulatory Visit: Payer: Self-pay | Admitting: *Deleted

## 2023-08-07 DIAGNOSIS — R931 Abnormal findings on diagnostic imaging of heart and coronary circulation: Secondary | ICD-10-CM

## 2023-08-07 DIAGNOSIS — E785 Hyperlipidemia, unspecified: Secondary | ICD-10-CM

## 2023-08-07 DIAGNOSIS — I77819 Aortic ectasia, unspecified site: Secondary | ICD-10-CM

## 2023-08-07 DIAGNOSIS — R918 Other nonspecific abnormal finding of lung field: Secondary | ICD-10-CM

## 2023-08-07 MED ORDER — ROSUVASTATIN CALCIUM 40 MG PO TABS
40.0000 mg | ORAL_TABLET | Freq: Every day | ORAL | 3 refills | Status: DC
Start: 2023-08-07 — End: 2024-08-04

## 2023-08-22 ENCOUNTER — Other Ambulatory Visit: Payer: Self-pay | Admitting: Thoracic Surgery (Cardiothoracic Vascular Surgery)

## 2023-08-22 DIAGNOSIS — I7121 Aneurysm of the ascending aorta, without rupture: Secondary | ICD-10-CM

## 2023-08-24 NOTE — Progress Notes (Unsigned)
[facility] [address], [address] [place], [place] [address] [phone]  Date:  08/29/2023   Name:  Parker Harrison   DOB:  September 06, 1941   MRN:  [id number]  PCP:  Pearline Cables, MD    Chief Complaint: No chief complaint on file.   History of Present Illness:  Parker Harrison is a 82 y.o. very pleasant male patient who presents with the following:  Patient seen today for periodic follow-up Most recent visit with myself was in March - history of hyperlipidemia, prostate cancer, quad rupture, hearing loss, pre-diabetes but most recent A1c normal We have taken over his PSA surveillance; he has been released by urology.  History of prostatectomy, expected PSA of 0  Enjoys tennis and other physical activity  We did a coronary calcium in the spring which led to diagnosis of an ascending aortic aneurysm. He discussed this with cardiothoracic surgery in April.  He was counseled to avoid fluoroquinolones, will continue surveillance of aneurysm  He was also unfortunately found to have severe three-vessel coronary disease He consulted with cardiology who recommended a treadmill test and they added Crestor He completed his treadmill in May, did well  Flu vaccine COVID booster  Lab Results  Component Value Date   PSA 0.00 (L) 08/28/2022   PSA 0.00 Repeated and verified X2. (L) 08/04/2021   PSA <0.1 08/04/2020    Lab Results  Component Value Date   HGBA1C 5.8 02/26/2023    Patient Active Problem List   Diagnosis Date Noted   Elevated coronary artery calcium score 04/01/2023   Acquired dilation of ascending aorta and aortic root (HCC) 04/01/2023   Lung nodules 03/10/2023   Skin cancer 10/12/2022   Pre-diabetes 08/01/2020   Hearing loss 05/25/2015   Adenocarcinoma of prostate (HCC) 02/18/2013   Hypercholesteremia 11/24/2012   Personal history of colonic polyps 11/22/2012   Quadriceps tendon rupture 08/16/2011   Tear of  meniscus, knee, medial 07/20/2011    Past Medical History:  Diagnosis Date   Adenomatous polyp of colon    Cataract    Cataract    bilateral   Hearing aid worn    BILATERAL   Hypercholesterolemia    NO MEDS   Prostate cancer [facility])     Past Surgical History:  Procedure Laterality Date   APPENDECTOMY     CATARACT EXTRACTION     right- 01-19-22, left 02-02-22   COLONOSCOPY     LEG WOUND REPAIR / CLOSURE Left 12/11/1998   INJURY   PROSTATE BIOPSY     PROSTATE SURGERY     RADIOACTIVE SEED IMPLANT N/A 05/29/2013   Procedure: RADIOACTIVE SEED IMPLANT;  Surgeon: Marcine Matar, MD;  Location: [facility];  Service: Urology;  Laterality: N/A;   TONSILLECTOMY AND ADENOIDECTOMY  AGE 24   VASECTOMY      Social History   Tobacco Use   Smoking status: Never   Smokeless tobacco: Never  Vaping Use   Vaping status: Never Used  Substance Use Topics   Alcohol use: Yes    Alcohol/week: 6.0 standard drinks of alcohol    Types: 4 Glasses of wine, 2 Cans of beer per week    Comment: 5-6 drinks   Drug use: No    Family History  Problem Relation Age of Onset   Dementia Mother    Prostate cancer Father    Prostate cancer Brother    Cancer Brother  Hyperlipidemia Brother    Cancer Brother    Hyperlipidemia Brother    Sudden death Neg Hx    Hypertension Neg Hx    Heart attack Neg Hx    Diabetes Neg Hx    Colon cancer Neg Hx    Esophageal cancer Neg Hx    Stomach cancer Neg Hx    Rectal cancer Neg Hx     No Known Allergies  Medication list has been reviewed and updated.  Current Outpatient Medications on File Prior to Visit  Medication Sig Dispense Refill   rosuvastatin (CRESTOR) 40 MG tablet Take 1 tablet (40 mg total) by mouth daily. 90 tablet 3   No current facility-administered medications on file prior to visit.    Review of Systems:  As per HPI- otherwise negative.   Physical Examination: There were no vitals filed for this visit. There  were no vitals filed for this visit. There is no height or weight on file to calculate BMI. Ideal Body Weight:    GEN: no acute distress. HEENT: Atraumatic, Normocephalic.  Ears and Nose: No external deformity. CV: RRR, No M/G/R. No JVD. No thrill. No extra heart sounds. PULM: CTA B, no wheezes, crackles, rhonchi. No retractions. No resp. distress. No accessory muscle use. ABD: S, NT, ND, +BS. No rebound. No HSM. EXTR: No c/c/e PSYCH: Normally interactive. Conversant.    Assessment and Plan: ***  Signed Abbe Amsterdam, MD

## 2023-08-24 NOTE — Patient Instructions (Incomplete)
It was good to see you today I will do your annual PSA screening Recommend COVID booster, flu shot this fall Have a great fall season, please see me in about 6 months

## 2023-08-29 ENCOUNTER — Ambulatory Visit (INDEPENDENT_AMBULATORY_CARE_PROVIDER_SITE_OTHER): Payer: PPO | Admitting: Family Medicine

## 2023-08-29 ENCOUNTER — Encounter: Payer: Self-pay | Admitting: Family Medicine

## 2023-08-29 VITALS — BP 112/60 | HR 56 | Temp 97.7°F | Resp 18 | Ht 71.0 in | Wt 176.0 lb

## 2023-08-29 DIAGNOSIS — E785 Hyperlipidemia, unspecified: Secondary | ICD-10-CM | POA: Diagnosis not present

## 2023-08-29 DIAGNOSIS — R7303 Prediabetes: Secondary | ICD-10-CM | POA: Diagnosis not present

## 2023-08-29 DIAGNOSIS — Z8546 Personal history of malignant neoplasm of prostate: Secondary | ICD-10-CM | POA: Diagnosis not present

## 2023-08-29 DIAGNOSIS — I7121 Aneurysm of the ascending aorta, without rupture: Secondary | ICD-10-CM

## 2023-08-29 DIAGNOSIS — Z23 Encounter for immunization: Secondary | ICD-10-CM

## 2023-08-29 DIAGNOSIS — R931 Abnormal findings on diagnostic imaging of heart and coronary circulation: Secondary | ICD-10-CM | POA: Diagnosis not present

## 2023-08-29 DIAGNOSIS — Z130 Encounter for screening for diseases of the blood and blood-forming organs and certain disorders involving the immune mechanism: Secondary | ICD-10-CM | POA: Diagnosis not present

## 2023-08-29 LAB — COMPREHENSIVE METABOLIC PANEL WITH GFR
ALT: 29 U/L (ref 0–53)
AST: 28 U/L (ref 0–37)
Albumin: 4.4 g/dL (ref 3.5–5.2)
Alkaline Phosphatase: 75 U/L (ref 39–117)
BUN: 14 mg/dL (ref 6–23)
CO2: 30 meq/L (ref 19–32)
Calcium: 9.5 mg/dL (ref 8.4–10.5)
Chloride: 101 meq/L (ref 96–112)
Creatinine, Ser: 1.09 mg/dL (ref 0.40–1.50)
GFR: 63.74 mL/min (ref >60.00)
Glucose, Bld: 86 mg/dL (ref 70–99)
Potassium: 4.2 meq/L (ref 3.5–5.1)
Sodium: 138 meq/L (ref 135–145)
Total Bilirubin: 0.9 mg/dL (ref 0.2–1.2)
Total Protein: 6.7 g/dL (ref 6.0–8.3)

## 2023-08-29 LAB — CBC
HCT: 41.8 % (ref 39.0–52.0)
Hemoglobin: 13.5 g/dL (ref 13.0–17.0)
MCHC: 32.4 g/dL (ref 30.0–36.0)
MCV: 93.1 fl (ref 78.0–100.0)
Platelets: 205 10*3/uL (ref 150.0–400.0)
RBC: 4.49 Mil/uL (ref 4.22–5.81)
RDW: 13.6 % (ref 11.5–15.5)
WBC: 4.8 10*3/uL (ref 4.0–10.5)

## 2023-08-29 LAB — PSA: PSA: 0 ng/mL — ABNORMAL LOW (ref 0.10–4.00)

## 2023-08-29 LAB — HEMOGLOBIN A1C: Hgb A1c MFr Bld: 5.8 % (ref 4.6–6.5)

## 2023-09-11 ENCOUNTER — Encounter: Payer: Self-pay | Admitting: Family Medicine

## 2023-10-12 ENCOUNTER — Ambulatory Visit
Admission: RE | Admit: 2023-10-12 | Discharge: 2023-10-12 | Disposition: A | Discharge status: Home / Self Care | Payer: PPO | Source: Ambulatory Visit | Attending: Thoracic Surgery (Cardiothoracic Vascular Surgery) | Admitting: Thoracic Surgery (Cardiothoracic Vascular Surgery)

## 2023-10-12 DIAGNOSIS — I7121 Aneurysm of the ascending aorta, without rupture: Secondary | ICD-10-CM

## 2023-10-12 MED ORDER — IOPAMIDOL (ISOVUE-370) INJECTION 76%
200.0000 mL | Freq: Once | INTRAVENOUS | Status: AC | PRN
  Administered 2023-10-12: 75 mL via INTRAVENOUS

## 2023-10-16 ENCOUNTER — Encounter: Payer: Self-pay | Admitting: Thoracic Surgery (Cardiothoracic Vascular Surgery)

## 2023-10-16 ENCOUNTER — Ambulatory Visit: Payer: PPO | Admitting: Thoracic Surgery (Cardiothoracic Vascular Surgery)

## 2023-10-16 VITALS — BP 142/72 | HR 52 | Resp 20 | Ht 71.0 in | Wt 176.0 lb

## 2023-10-16 DIAGNOSIS — I7121 Aneurysm of the ascending aorta, without rupture: Secondary | ICD-10-CM

## 2023-10-16 NOTE — Progress Notes (Signed)
[address]       [place] [address]             [phone]     HPI: Parker Harrison returns for follow-up of his ascending aneurysm.  Parker Harrison is an 82 year old man with a history of an ascending aneurysm, coronary calcification, prostate cancer, prostatectomy, and hyperlipidemia.  He had a cardiac CT for coronary calcium scoring in April 2024.  It showed an ascending aneurysm.  That was followed up with a CT of the chest which showed a 4.7 cm aneurysm.  He saw Gershon Crane in April.  Now returns for 81-month follow-up.  He feels well.  No chest pain, pressure, tightness, or shortness of breath.  Exercises on a regular basis.  Past Medical History:  Diagnosis Date   Adenomatous polyp of colon    Cataract    Cataract    bilateral   Hearing aid worn    BILATERAL   Hypercholesterolemia    NO MEDS   Prostate cancer (HCC)      Current Outpatient Medications  Medication Sig Dispense Refill   rosuvastatin (CRESTOR) 40 MG tablet Take 1 tablet (40 mg total) by mouth daily. 90 tablet 3   No current facility-administered medications for this visit.    Physical Exam BP (!) 142/72 (BP Location: Left Arm, Patient Position: Sitting, Cuff Size: Normal)   Pulse (!) 52   Resp 20   Ht 5\' 11"  (1.803 m)   Wt 176 lb (79.8 kg)   SpO2 99% Comment: RA  BMI 24.55 kg/m  Well-appearing 82 year old man in no acute distress Alert and oriented x 3 with no focal deficits Lungs clear with equal breath sounds bilaterally Cardiac regular rate and rhythm normal S1 and S2 with no rubs or murmurs No carotid bruits 2+ pulses throughout No peripheral edema  Diagnostic Tests: CT ANGIOGRAPHY CHEST WITH CONTRAST   TECHNIQUE: Multidetector CT imaging of the chest was performed using the standard protocol during bolus administration of intravenous contrast. Multiplanar CT image reconstructions and MIPs were obtained to evaluate the vascular anatomy.   RADIATION DOSE REDUCTION:  This exam was performed according to the departmental dose-optimization program which includes automated exposure control, adjustment of the mA and/or kV according to patient size and/or use of iterative reconstruction technique.   CONTRAST:  75mL ISOVUE-370 IOPAMIDOL (ISOVUE-370) INJECTION 76%   COMPARISON:  March 07, 2023.   FINDINGS: Cardiovascular: 4.6 cm ascending thoracic aortic aneurysm is noted without dissection. Great vessels are widely patent without stenosis. Normal cardiac size. No pericardial effusion. Coronary artery calcifications are noted.   Mediastinum/Nodes: No enlarged mediastinal, hilar, or axillary lymph nodes. Thyroid gland, trachea, and esophagus demonstrate no significant findings.   Lungs/Pleura: No pneumothorax or pleural effusion is noted. Minimal bibasilar subsegmental atelectasis. Stable 3 mm nodule is noted in right middle lobe best seen on image number 1 of 7 of series 7. Stable 4 mm nodule is also noted in right middle lobe best seen on image number 91 of series 7.   Upper Abdomen: No acute abnormality.   Musculoskeletal: No chest wall abnormality. No acute or significant osseous findings.   Review of the MIP images confirms the above findings.   IMPRESSION: Grossly stable 4.6 cm Ascending thoracic aortic aneurysm. Recommend semi-annual imaging followup by CTA or MRA and referral to cardiothoracic surgery if not already obtained. This recommendation follows 2010 ACCF/AHA/AATS/ACR/ASA/SCA/SCAI/SIR/STS/SVM Guidelines for the Diagnosis and Management of Patients With Thoracic Aortic Disease. Circulation.  2010; 121: O130-Q657. Aortic aneurysm NOS (ICD10-I71.9).   Coronary artery calcifications are noted suggesting coronary artery disease.   Stable right middle lobe nodules are noted, the largest measuring 4 mm. No follow-up needed if patient is low-risk (and has no known or suspected primary neoplasm). Non-contrast chest CT can be  considered in 12 months if patient is high-risk. This recommendation follows the consensus statement: Guidelines for Management of Incidental Pulmonary Nodules Detected on CT Images: From the Fleischner Society 2017; Radiology 2017; 284:228-243.     Electronically Signed   By: Lupita Raider M.D.   On: 10/12/2023 15:29 I personally reviewed the CT images.  There is a 4.6 cm ascending aneurysm, coronary artery calcification, 4 mm right middle lobe lung nodule unchanged from April.  Impression: Parker Harrison is an 82 year old man with a history of an ascending aneurysm, coronary calcification, prostate cancer, prostatectomy, and hyperlipidemia.   Ascending aneurysm-stable at 4.6 cm.  No indication for surgery.  I reviewed the indications for surgery with Parker Harrison.  We discussed issues related to blood pressure management as regards aortic dissection.  Blood pressure was mildly elevated today.  No history of hypertension.  Advised him to obtain a blood pressure cuff and check himself at home.  Coronary calcification-no anginal symptoms.  On rosuvastatin   Plan: Return in 6 months with CT angiogram of chest  I spent over 20 minutes in review of records, images, and in consultation with Parker Harrison today. Loreli Slot, MD [facility] [phone]

## 2023-11-12 DIAGNOSIS — R931 Abnormal findings on diagnostic imaging of heart and coronary circulation: Secondary | ICD-10-CM | POA: Diagnosis not present

## 2023-11-12 DIAGNOSIS — E785 Hyperlipidemia, unspecified: Secondary | ICD-10-CM | POA: Diagnosis not present

## 2023-11-13 LAB — LIPID PANEL
Chol/HDL Ratio: 2.6 {ratio} (ref 0.0–5.0)
Cholesterol, Total: 165 mg/dL (ref 100–199)
HDL: 64 mg/dL (ref >39)
LDL Chol Calc (NIH): 87 mg/dL (ref 0–99)
Triglycerides: 74 mg/dL (ref 0–149)
VLDL Cholesterol Cal: 14 mg/dL (ref 5–40)

## 2023-11-22 ENCOUNTER — Telehealth: Payer: Self-pay | Admitting: *Deleted

## 2023-11-22 DIAGNOSIS — E785 Hyperlipidemia, unspecified: Secondary | ICD-10-CM

## 2023-11-22 DIAGNOSIS — R931 Abnormal findings on diagnostic imaging of heart and coronary circulation: Secondary | ICD-10-CM

## 2023-11-22 MED ORDER — EZETIMIBE 10 MG PO TABS: 10.0000 mg | ORAL_TABLET | Freq: Every day | ORAL | 3 refills | Status: DC

## 2023-11-22 NOTE — Telephone Encounter (Signed)
-----   Message from Rollene Rotunda sent at 11/18/2023 10:29 AM EST ----- LDL is still not at target and I would like to add Zetia 10 mg PO daily to current meds and repeat a lipid profile in 3 months.  Call Mr. Cantu with the results and send results to [name], Gwenlyn Found, MD

## 2023-11-22 NOTE — Telephone Encounter (Signed)
Pt has reviewed results via my chart  New script sent to the pharmacy  Lab orders mailed to the pt  

## 2024-01-21 DIAGNOSIS — L821 Other seborrheic keratosis: Secondary | ICD-10-CM | POA: Diagnosis not present

## 2024-01-21 DIAGNOSIS — L858 Other specified epidermal thickening: Secondary | ICD-10-CM | POA: Diagnosis not present

## 2024-01-21 DIAGNOSIS — L570 Actinic keratosis: Secondary | ICD-10-CM | POA: Diagnosis not present

## 2024-01-21 DIAGNOSIS — Z85828 Personal history of other malignant neoplasm of skin: Secondary | ICD-10-CM | POA: Diagnosis not present

## 2024-02-20 DIAGNOSIS — R931 Abnormal findings on diagnostic imaging of heart and coronary circulation: Secondary | ICD-10-CM | POA: Diagnosis not present

## 2024-02-20 DIAGNOSIS — E785 Hyperlipidemia, unspecified: Secondary | ICD-10-CM | POA: Diagnosis not present

## 2024-02-20 LAB — LIPID PANEL
Chol/HDL Ratio: 2.3 ratio (ref 0.0–5.0)
Cholesterol, Total: 142 mg/dL (ref 100–199)
HDL: 61 mg/dL (ref >39)
LDL Chol Calc (NIH): 67 mg/dL (ref 0–99)
Triglycerides: 68 mg/dL (ref 0–149)
VLDL Cholesterol Cal: 14 mg/dL (ref 5–40)

## 2024-02-24 NOTE — Patient Instructions (Incomplete)
 It was great to see you again today Recommend COVID booster nonetheless 6 months or so, recommend dose of RSV vaccine at your pharmacy- already done!   I will reach out to Dr Antoine Poche for you about your pulse being slow - if you start to have any symptoms such as feeing lightheaded please let us know

## 2024-02-24 NOTE — Progress Notes (Unsigned)
 [facility] at [facility] [address], [address] [place], [place] [address] [phone]  Date:  02/27/2024   Name:  Parker Harrison   DOB:  10/13/1941   MRN:  [id number]  PCP:  Pearline Cables, MD    Chief Complaint: No chief complaint on file.   History of Present Illness:  Parker Harrison is a 83 y.o. very pleasant male patient who presents with the following:  Patient seen today for periodic follow-up Most recent visit with myself was in September- history of hyperlipidemia, prostate cancer, quad rupture, hearing loss, pre-diabetes but most recent A1c normal We have taken over his PSA surveillance; he has been released by urology.  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 Dorris Fetch in November.  Most recent CT shows a 4.6 cm ascending aneurysm, plan for follow-up in 6 months with CT angiogram  Discovery of severe three-vessel coronary disease last year, he has been seeing cardiology and is using Crestor He last saw Dr. Antoine Poche about 1 year ago He follows up regularly with dermatology  Can recommend a COVID booster Colon cancer screening is up-to-date Shingrix, flu complete Recommend RSV Lab Results  Component Value Date   HGBA1C 5.8 08/29/2023     Cholesterol panel done just recently, otherwise most recent labs were in September His LDL has gotten to goal, less than 70 Lab Results  Component Value Date   PSA 0.00 (L) 08/29/2023   PSA 0.00 (L) 08/28/2022   PSA 0.00 Repeated and verified X2. (L) 08/04/2021   Zetia 10 Crestor 40   Patient Active Problem List   Diagnosis Date Noted   Elevated coronary artery calcium score 04/01/2023   Acquired dilation of ascending aorta and aortic root (HCC) 04/01/2023   Lung nodules 03/10/2023   Skin cancer 10/12/2022    Pre-diabetes 08/01/2020   Hearing loss 05/25/2015   Adenocarcinoma of prostate (HCC) 02/18/2013   Hypercholesteremia 11/24/2012   History of colonic polyps 11/22/2012   Quadriceps tendon rupture 08/16/2011   Tear of meniscus, knee, medial 07/20/2011    Past Medical History:  Diagnosis Date   Adenomatous polyp of colon    Cataract    Cataract    bilateral   Hearing aid worn    BILATERAL   Hypercholesterolemia    NO MEDS   Prostate cancer [facility])     Past Surgical History:  Procedure Laterality Date   APPENDECTOMY     CATARACT EXTRACTION     right- 01-19-22, left 02-02-22   COLONOSCOPY     LEG WOUND REPAIR / CLOSURE Left 12/11/1998   INJURY   PROSTATE BIOPSY     PROSTATE SURGERY     RADIOACTIVE SEED IMPLANT N/A 05/29/2013   Procedure: RADIOACTIVE SEED IMPLANT;  Surgeon: Marcine Matar, MD;  Location[facility];  Service: Urology;  Laterality: N/A;   TONSILLECTOMY AND ADENOIDECTOMY  AGE 70   VASECTOMY      Social History   Tobacco Use   Smoking status: Never   Smokeless tobacco: Never  Vaping Use   Vaping status: Never Used  Substance Use Topics   Alcohol use: Yes    Alcohol/week: 6.0 standard drinks of alcohol    Types: 4 Glasses of wine, 2 Cans of beer per week    Comment: 5-6 drinks  Drug use: No    Family History  Problem Relation Age of Onset   Dementia Mother    Prostate cancer Father    Prostate cancer Brother    Cancer Brother    Hyperlipidemia Brother    Cancer Brother    Hyperlipidemia Brother    Sudden death Neg Hx    Hypertension Neg Hx    Heart attack Neg Hx    Diabetes Neg Hx    Colon cancer Neg Hx    Esophageal cancer Neg Hx    Stomach cancer Neg Hx    Rectal cancer Neg Hx     No Known Allergies  Medication list has been reviewed and updated.  Current Outpatient Medications on File Prior to Visit  Medication Sig Dispense Refill   ezetimibe (ZETIA) 10 MG tablet Take 1 tablet (10 mg total) by mouth daily. 90 tablet  3   rosuvastatin (CRESTOR) 40 MG tablet Take 1 tablet (40 mg total) by mouth daily. 90 tablet 3   No current facility-administered medications on file prior to visit.    Review of Systems:  As per HPI- otherwise negative.   Physical Examination: There were no vitals filed for this visit. There were no vitals filed for this visit. There is no height or weight on file to calculate BMI. Ideal Body Weight:    GEN: no acute distress. HEENT: Atraumatic, Normocephalic.  Ears and Nose: No external deformity. CV: RRR, No M/G/R. No JVD. No thrill. No extra heart sounds. PULM: CTA B, no wheezes, crackles, rhonchi. No retractions. No resp. distress. No accessory muscle use. ABD: S, NT, ND, +BS. No rebound. No HSM. EXTR: No c/c/e PSYCH: Normally interactive. Conversant.    Assessment and Plan: ***  Signed Abbe Amsterdam, MD

## 2024-02-27 ENCOUNTER — Encounter: Payer: Self-pay | Admitting: Family Medicine

## 2024-02-27 ENCOUNTER — Ambulatory Visit (INDEPENDENT_AMBULATORY_CARE_PROVIDER_SITE_OTHER): Payer: PPO | Admitting: Family Medicine

## 2024-02-27 VITALS — BP 134/62 | HR 44 | Ht 71.0 in | Wt 175.2 lb

## 2024-02-27 DIAGNOSIS — I7121 Aneurysm of the ascending aorta, without rupture: Secondary | ICD-10-CM | POA: Diagnosis not present

## 2024-02-27 DIAGNOSIS — R931 Abnormal findings on diagnostic imaging of heart and coronary circulation: Secondary | ICD-10-CM

## 2024-02-27 DIAGNOSIS — R7303 Prediabetes: Secondary | ICD-10-CM

## 2024-02-27 DIAGNOSIS — E785 Hyperlipidemia, unspecified: Secondary | ICD-10-CM

## 2024-02-27 DIAGNOSIS — Z130 Encounter for screening for diseases of the blood and blood-forming organs and certain disorders involving the immune mechanism: Secondary | ICD-10-CM

## 2024-02-27 DIAGNOSIS — R001 Bradycardia, unspecified: Secondary | ICD-10-CM | POA: Diagnosis not present

## 2024-02-27 DIAGNOSIS — Z8546 Personal history of malignant neoplasm of prostate: Secondary | ICD-10-CM | POA: Diagnosis not present

## 2024-02-27 LAB — BASIC METABOLIC PANEL
BUN: 18 mg/dL (ref 6–23)
CO2: 30 meq/L (ref 19–32)
Calcium: 10 mg/dL (ref 8.4–10.5)
Chloride: 99 meq/L (ref 96–112)
Creatinine, Ser: 1.1 mg/dL (ref 0.40–1.50)
GFR: 62.82 mL/min (ref >60.00)
Glucose, Bld: 94 mg/dL (ref 70–99)
Potassium: 4.5 meq/L (ref 3.5–5.1)
Sodium: 136 meq/L (ref 135–145)

## 2024-02-27 LAB — CBC
HCT: 42.5 % (ref 39.0–52.0)
Hemoglobin: 14.1 g/dL (ref 13.0–17.0)
MCHC: 33.1 g/dL (ref 30.0–36.0)
MCV: 92.7 fl (ref 78.0–100.0)
Platelets: 188 10*3/uL (ref 150.0–400.0)
RBC: 4.58 Mil/uL (ref 4.22–5.81)
RDW: 12.9 % (ref 11.5–15.5)
WBC: 4.2 10*3/uL (ref 4.0–10.5)

## 2024-02-27 LAB — HEMOGLOBIN A1C: Hgb A1c MFr Bld: 5.9 % (ref 4.6–6.5)

## 2024-02-27 LAB — TSH: TSH: 2.31 u[IU]/mL (ref 0.35–5.50)

## 2024-02-28 ENCOUNTER — Telehealth: Payer: Self-pay

## 2024-02-28 NOTE — Telephone Encounter (Signed)
 Called and spoke with patient to inform him that his PCP had sent Dr Antoine Poche a message regarding his bradycardia. Dr Antoine Poche doesn't have any available appointments until the end of May and asked me to call patient to offer an appointment with an APP.  Appointment was scheduled for [facility] PA-C.

## 2024-03-10 DIAGNOSIS — E785 Hyperlipidemia, unspecified: Secondary | ICD-10-CM | POA: Insufficient documentation

## 2024-03-10 NOTE — Progress Notes (Unsigned)
 Cardiology Office Note:   Date:  03/13/2024  ID:  Parker Harrison, DOB Nov 29, 1941, MRN [id number] PCP: Pearline Cables, MD  [facility] Providers Cardiologist:  Rollene Rotunda, MD {  History of Present Illness:   Parker Harrison is a 83 y.o. male who presents for for evaluation of elevated coronary calcium.  This was noted on a CT.  He subsequently had a coronary calcium score of 3049 or 90th percentile.  He has not had any prior cardiac history other than wearing a monitor decades ago.  He is being followed before a 47 mm aorta.   He had a negative POET (Plain Old Exercise Treadmill).    Since I last saw him he is still exercising.  Plays tennis 3 times a week.  He walks. The patient denies any new symptoms such as chest discomfort, neck or arm discomfort. There has been no new shortness of breath, PND or orthopnea. There have been no reported palpitations, presyncope or syncope.    Lipoprotein (a)  Date/Time Value Ref Range Status  07/30/2023 08:44 AM <8.4 <75.0 nmol/L Final    Comment:    **Results verified by repeat testing** Note:  Values greater than or equal to 75.0 nmol/L may        indicate an independent risk factor for CHD,        but must be evaluated with caution when applied        to non-Caucasian populations due to the        influence of genetic factors on Lp(a) across        ethnicities.     ROS: As stated in the HPI and negative for all other systems.  Studies Reviewed:    EKG:   EKG Interpretation Date/Time:  Thursday March 13 2024 11:45:20 EDT Ventricular Rate:  53 PR Interval:  208 QRS Duration:  96 QT Interval:  426 QTC Calculation: 399 R Axis:   0  Text Interpretation: Sinus bradycardia When compared with ECG of 10-Apr-2013 14:04, Premature atrial complexes are no longer Present Confirmed by Rollene Rotunda ([id number]) on 03/13/2024 12:13:34 PM    Risk Assessment/Calculations:              Physical Exam:   VS:  BP 100/70 (BP Location: Left Arm,  Patient Position: Sitting, Cuff Size: Normal)   Pulse (!) 53   Ht 5\' 11"  (1.803 m)   Wt 175 lb (79.4 kg)   BMI 24.41 kg/m    Wt Readings from Last 3 Encounters:  03/13/24 175 lb (79.4 kg)  02/27/24 175 lb 3.2 oz (79.5 kg)  10/16/23 176 lb (79.8 kg)     GEN: Well nourished, well developed in no acute distress NECK: No JVD; No carotid bruits CARDIAC: RRR, no murmurs, rubs, gallops RESPIRATORY:  Clear to auscultation without rales, wheezing or rhonchi  ABDOMEN: Soft, non-tender, non-distended EXTREMITIES:  No edema; No deformity   ASSESSMENT AND PLAN:   Elevated coronary calcium: The patient has no symptoms.  No change in therapy is indicated.  He will continue with aggressive primary risk reduction.   Enlarged ascending aorta: He is following with Dr. Dorris Fetch.   CT was 46 mm in Nov 2024.    Dyslipidemia: He was not at target and in December I added Zetia 10 mg daily.  His LDL is now 76 with an HDL of 61.  This is much improved compared to previous.  He is on Crestor 40.  I am going to go  ahead and continue this regimen and we talked about not eating the steak that he is eating once or twice a week and going to fish.     HTN: His blood pressure was controlled.  I did note that his wrist cuff seem to be reading his pressures about 20 mmHg higher than I was getting it.  He might go look into getting an arm cuff.  Follow up with me in one year.   Signed, Rollene Rotunda, MD

## 2024-03-13 ENCOUNTER — Encounter: Payer: Self-pay | Admitting: Cardiology

## 2024-03-13 ENCOUNTER — Ambulatory Visit: Attending: Cardiology | Admitting: Cardiology

## 2024-03-13 VITALS — BP 100/70 | HR 53 | Ht 71.0 in | Wt 175.0 lb

## 2024-03-13 DIAGNOSIS — E785 Hyperlipidemia, unspecified: Secondary | ICD-10-CM

## 2024-03-13 DIAGNOSIS — R931 Abnormal findings on diagnostic imaging of heart and coronary circulation: Secondary | ICD-10-CM | POA: Diagnosis not present

## 2024-03-13 DIAGNOSIS — I77810 Thoracic aortic ectasia: Secondary | ICD-10-CM | POA: Diagnosis not present

## 2024-03-13 NOTE — Patient Instructions (Signed)
 Medication Instructions:  No changes.  *If you need a refill on your cardiac medications before your next appointment, please call your pharmacy*   Follow-Up: At [facility], you and your health needs are our priority.  As part of our continuing mission to provide you with exceptional heart care, our providers are all part of one team.  This team includes your primary Cardiologist (physician) and Advanced Practice Providers or APPs (Physician Assistants and Nurse Practitioners) who all work together to provide you with the care you need, when you need it.  Your next appointment:   1 year(s)  Provider:   Rollene Rotunda, MD     We recommend signing up for the patient portal called "MyChart".  Sign up information is provided on this After Visit Summary.  MyChart is used to connect with patients for Virtual Visits (Telemedicine).  Patients are able to view lab/test results, encounter notes, upcoming appointments, etc.  Non-urgent messages can be sent to your provider as well.   To learn more about what you can do with MyChart, go to [email or web address].   Other Instructions       1st Floor: - Lobby - Registration  - Pharmacy  - Lab - Cafe  2nd Floor: - PV Lab - Diagnostic Testing (echo, CT, nuclear med)  3rd Floor: - Vacant  4th Floor: - TCTS (cardiothoracic surgery) - AFib Clinic - Structural Heart Clinic - Vascular Surgery  - Vascular Ultrasound  5th Floor: - HeartCare Cardiology (general and EP) - Clinical Pharmacy for coumadin, hypertension, lipid, weight-loss medications, and med management appointments    Valet parking services will be available as well.

## 2024-03-24 ENCOUNTER — Other Ambulatory Visit: Payer: Self-pay | Admitting: Thoracic Surgery (Cardiothoracic Vascular Surgery)

## 2024-03-24 DIAGNOSIS — I7121 Aneurysm of the ascending aorta, without rupture: Secondary | ICD-10-CM

## 2024-03-25 DIAGNOSIS — Z961 Presence of intraocular lens: Secondary | ICD-10-CM | POA: Diagnosis not present

## 2024-03-25 DIAGNOSIS — H52203 Unspecified astigmatism, bilateral: Secondary | ICD-10-CM | POA: Diagnosis not present

## 2024-04-08 ENCOUNTER — Ambulatory Visit: Admitting: Student

## 2024-04-10 ENCOUNTER — Ambulatory Visit
Admission: RE | Admit: 2024-04-10 | Discharge: 2024-04-10 | Disposition: A | Discharge status: Home / Self Care | Source: Ambulatory Visit | Attending: Thoracic Surgery (Cardiothoracic Vascular Surgery) | Admitting: Thoracic Surgery (Cardiothoracic Vascular Surgery)

## 2024-04-10 ENCOUNTER — Other Ambulatory Visit

## 2024-04-10 DIAGNOSIS — I7121 Aneurysm of the ascending aorta, without rupture: Secondary | ICD-10-CM | POA: Diagnosis not present

## 2024-04-10 DIAGNOSIS — I700 Atherosclerosis of aorta: Secondary | ICD-10-CM | POA: Diagnosis not present

## 2024-04-10 DIAGNOSIS — R911 Solitary pulmonary nodule: Secondary | ICD-10-CM | POA: Diagnosis not present

## 2024-04-10 MED ORDER — IOPAMIDOL (ISOVUE-370) INJECTION 76%
75.0000 mL | Freq: Once | INTRAVENOUS | Status: AC | PRN
  Administered 2024-04-10: 75 mL via INTRAVENOUS

## 2024-04-16 ENCOUNTER — Other Ambulatory Visit

## 2024-04-23 NOTE — Progress Notes (Addendum)
 [address] Parker Harrison        [name], [place] [address]            [phone]    PCP is [name], [name], MD Referring Provider is [name], [name], MD  Chief Complaint: Ascending thoracic aortic aneurysm and RML lung nodule   HPI: This is an 83 year old male with a past medical history of hypercholesterolemia, coronary calcification, and prostate cancer who on coronary calcium  CT in March 2024 was incidentally found to have an ascending thoracic aortic aneurysm, 4.8 cm. He was last seen by Dr. Luna Salinas on 10/16/2023 and the ATAA measured 4.6 cm. The 4 mm right middle lobe lung nodule was also unchanged from April. He presents today for further surveillance of his ascending thoracic aortic aneurysm. He denies chest pain, pressure, or tightness. He plays tennis three times weekly.   Past Medical History:  Diagnosis Date   Adenomatous polyp of colon    Cataract    Cataract    bilateral   Hearing aid worn    BILATERAL   Hypercholesterolemia    NO MEDS   Prostate cancer [facility])     Past Surgical History:  Procedure Laterality Date   APPENDECTOMY     CATARACT EXTRACTION     right- 01-19-22, left 02-02-22   COLONOSCOPY     LEG WOUND REPAIR / CLOSURE Left 12/11/1998   INJURY   PROSTATE BIOPSY     PROSTATE SURGERY     RADIOACTIVE SEED IMPLANT N/A 05/29/2013   Procedure: RADIOACTIVE SEED IMPLANT;  Surgeon: Trent Frizzle, MD;  Location: [facility];  Service: Urology;  Laterality: N/A;   TONSILLECTOMY AND ADENOIDECTOMY  AGE 83   VASECTOMY      Family History  Problem Relation Age of Onset   Dementia Mother    Prostate cancer Father    Prostate cancer Brother    Cancer Brother    Hyperlipidemia Brother    Cancer Brother    Hyperlipidemia Brother    Sudden death Neg Hx    Hypertension Neg Hx    Heart attack Neg Hx    Diabetes Neg Hx    Colon cancer Neg Hx    Esophageal cancer Neg Hx    Stomach cancer Neg Hx    Rectal cancer Neg Hx      Social History Social History   Tobacco Use   Smoking status: Never   Smokeless tobacco: Never  Vaping Use   Vaping status: Never Used  Substance Use Topics   Alcohol use: Yes    Alcohol/week: 6.0 standard drinks of alcohol    Types: 4 Glasses of wine, 2 Cans of beer per week    Comment: 5-6 drinks   Drug use: No    Current Outpatient Medications  Medication Sig Dispense Refill   ezetimibe  (ZETIA ) 10 MG tablet Take 1 tablet (10 mg total) by mouth daily. 90 tablet 3   rosuvastatin  (CRESTOR ) 40 MG tablet Take 1 tablet (40 mg total) by mouth daily. 90 tablet 3  Allergies: No Known Allergies  Vital Signs: Vitals:   04/29/24 1027  BP: (!) 143/83  Pulse: (!) 54  Resp: 20  SpO2: 91%     Physical Exam: CV-RRR, no murmur Neck- No carotid bruit Pulmonary- Clear to auscultation bilaterally Abdomen- Soft, non tender, bowel sounds present Extremities- No LE edema Neurologic- Grossly intact without focal deficit   Diagnostic Tests: Narrative & Impression  CLINICAL DATA:  Thoracic aortic aneurysm   EXAM: CT ANGIOGRAPHY CHEST WITH CONTRAST   TECHNIQUE: Multidetector CT imaging of the chest was performed using the standard protocol during bolus administration of intravenous contrast. Multiplanar CT image reconstructions and MIPs were obtained to evaluate the vascular anatomy.   RADIATION DOSE REDUCTION: This exam was performed according to the departmental dose-optimization program which includes automated exposure control, adjustment of the mA and/or kV according to patient size and/or use of iterative reconstruction technique.   CONTRAST:  75mL ISOVUE -370 IOPAMIDOL  (ISOVUE -370) INJECTION 76%   COMPARISON:  10/12/2023, 03/07/2023   FINDINGS: Cardiovascular: Stable 4.5 cm ascending thoracic aortic aneurysm. No evidence of dissection.   The heart is unremarkable without pericardial effusion. There is technically adequate opacification of the pulmonary  vasculature, with no evidence of pulmonary emboli. Atherosclerosis of the aorta and coronary vasculature unchanged.   Mediastinum/Nodes: No enlarged mediastinal, hilar, or axillary lymph nodes. Thyroid  gland, trachea, and esophagus demonstrate no significant findings.   Lungs/Pleura: No acute airspace disease, effusion, or pneumothorax. Dependent hypoventilatory changes within the lower lobes. Central airways are patent. Stable right middle lobe pulmonary nodules, largest measuring 4 mm reference image 111/7. No new pulmonary nodules or masses.   Upper Abdomen: No acute abnormality.   Musculoskeletal: No acute or destructive bony abnormalities. Reconstructed images demonstrate no additional findings.   Review of the MIP images confirms the above findings.   IMPRESSION: 1. Stable 4.5 cm ascending thoracic aortic aneurysm. No evidence of dissection. Ascending thoracic aortic aneurysm. Recommend semi-annual imaging followup by CTA or MRA and referral to cardiothoracic surgery if not already obtained. This recommendation follows 2010 ACCF/AHA/AATS/ACR/ASA/SCA/SCAI/SIR/STS/SVM Guidelines for the Diagnosis and Management of Patients With Thoracic Aortic Disease. Circulation. 2010; 121: R604-V409. Aortic aneurysm NOS (ICD10-I71.9) 2. No evidence of pulmonary embolus. 3. Stable sub 4 mm right middle lobe pulmonary nodules, which can be considered benign given long-term stability. No specific follow-up recommended. 4. Aortic Atherosclerosis (ICD10-I70.0). Coronary artery atherosclerosis.     Electronically Signed   By: Bobbye Burrow M.D.   On: 04/10/2024 14:47    Impression and Plan: BP is slightly elevated at this office visit. Patient went to the wrong office and was running late to this appointment.  CTA showed a stable 4.5 cm ascending aortic aneurysm.  He has not had an echocardiogram. We discussed the natural history and and risk factors for growth of ascending aortic  aneurysms.  We covered the importance of smoking cessation, tight blood pressure control, refraining from lifting heavy objects, and avoiding fluoroquinolones.   He does not need surgery for his ATAA at this time. We will continue surveillance and a repeat CTA was ordered for 6 months.  Allegra Arch, PA-C [facility] [phone]

## 2024-04-24 ENCOUNTER — Ambulatory Visit

## 2024-04-29 ENCOUNTER — Ambulatory Visit: Attending: Thoracic Surgery (Cardiothoracic Vascular Surgery) | Admitting: Physician Assistant

## 2024-04-29 ENCOUNTER — Encounter: Payer: Self-pay | Admitting: Physician Assistant

## 2024-04-29 ENCOUNTER — Other Ambulatory Visit

## 2024-04-29 VITALS — BP 143/83 | HR 54 | Resp 20 | Ht 71.0 in | Wt 177.4 lb

## 2024-04-29 DIAGNOSIS — I7121 Aneurysm of the ascending aorta, without rupture: Secondary | ICD-10-CM

## 2024-04-29 NOTE — Patient Instructions (Signed)
 Risk Modification in those with ascending thoracic aortic aneurysm:  Continue good control of blood pressure (prefer SBP 130/80 or less)  2. Avoid fluoroquinolone antibiotics (I.e Ciprofloxacin , Avelox, Levofloxacin, Ofloxacin)  3.  Use of statin (to decrease cardiovascular risk)-continue Rosuvastatin  (Crestor )  4.  Exercise and activity limitations is individualized, but in general, contact sports are to be avoided and one should avoid heavy lifting (defined as half of ideal body weight) and exercises involving sustained Valsalva maneuver.  5. Counseling for those suspected of having genetically mediated disease. First-degree relatives of those with TAA disease should be screened as well as those who have a connective tissue disease (I.e with Marfan syndrome, Ehlers-Danlos syndrome, and Loeys-Dietz syndrome) or a  bicuspid aortic valve,have an increased risk for complications related to TAA. He denies family history of TAA and personal history of connective tissue disorder.   6. He does not have a history of tobacco abuse

## 2024-05-01 ENCOUNTER — Ambulatory Visit

## 2024-05-12 DIAGNOSIS — H6123 Impacted cerumen, bilateral: Secondary | ICD-10-CM | POA: Diagnosis not present

## 2024-08-01 ENCOUNTER — Other Ambulatory Visit: Payer: Self-pay | Admitting: Cardiology

## 2024-08-01 DIAGNOSIS — I77810 Thoracic aortic ectasia: Secondary | ICD-10-CM

## 2024-08-01 DIAGNOSIS — R918 Other nonspecific abnormal finding of lung field: Secondary | ICD-10-CM

## 2024-08-01 DIAGNOSIS — R931 Abnormal findings on diagnostic imaging of heart and coronary circulation: Secondary | ICD-10-CM

## 2024-09-02 NOTE — Progress Notes (Unsigned)
 [facility] at [facility] [address] [place], [place] [address] [phone]  Date:  09/03/2024   Name:  Parker Harrison   DOB:  Dec 15, 1940   MRN:  [id number]  PCP:  Watt Harlene BROCKS, MD    Chief Complaint: No chief complaint on file.   History of Present Illness:  Parker Harrison is a 83 y.o. very pleasant male patient who presents with the following:  Patient seen today for periodic follow-up.  I saw him most recently in March - history of hyperlipidemia, prostate cancer, quad rupture, hearing loss, pre-diabetes but most recent A1c normal We have taken over his PSA surveillance; he has been released by urology.  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 Hendrickson/CT surgery PA was in May of this year.  They plan to repeat imaging in 6 months.  Recommend continued smoking avoidance, control of blood pressure, avoid heavy lifting, avoid quinolones  He also is followed by cardiology for three-vessel coronary disease, he is being treated with statin as well as Zetia  with good results He last saw Dr. Lavona in April-doing well, plan for 1 year follow-up  - Flu vaccine - Recommend COVID booster - Shingrix is complete -Recommend RSV if not done yet - Labs done in March, BMP, lipid, CBC, A1c 5.9, TSH - Due for PSA today -Colonoscopy done 2023, clear  Discussed the use of AI scribe software for clinical note transcription with the patient, who gave verbal consent to proceed.  History of Present Illness    Patient Active Problem List   Diagnosis Date Noted   Dyslipidemia 03/10/2024   Elevated coronary artery calcium  score 04/01/2023   Acquired dilation of ascending aorta and aortic root 04/01/2023   Lung nodules 03/10/2023   Skin cancer 10/12/2022   Pre-diabetes  08/01/2020   Hearing loss 05/25/2015   Adenocarcinoma of prostate (HCC) 02/18/2013   Hypercholesteremia 11/24/2012   History of colonic polyps 11/22/2012   Quadriceps tendon rupture 08/16/2011   Tear of meniscus, knee, medial 07/20/2011    Past Medical History:  Diagnosis Date   Adenomatous polyp of colon    Cataract    Cataract    bilateral   Hearing aid worn    BILATERAL   Hypercholesterolemia    NO MEDS   Prostate cancer [facility])     Past Surgical History:  Procedure Laterality Date   APPENDECTOMY     CATARACT EXTRACTION     right- 01-19-22, left 02-02-22   COLONOSCOPY     LEG WOUND REPAIR / CLOSURE Left 12/11/1998   INJURY   PROSTATE BIOPSY     PROSTATE SURGERY     RADIOACTIVE SEED IMPLANT N/A 05/29/2013   Procedure: RADIOACTIVE SEED IMPLANT;  Surgeon: Garnette Shack, MD;  Location: [facility];  Service: Urology;  Laterality: N/A;   TONSILLECTOMY AND ADENOIDECTOMY  AGE 74   VASECTOMY      Social History   Tobacco Use   Smoking status: Never   Smokeless tobacco: Never  Vaping Use   Vaping status: Never Used  Substance Use Topics   Alcohol use: Yes    Alcohol/week: 6.0 standard drinks of alcohol    Types: 4 Glasses of wine, 2 Cans of beer per week    Comment: 5-6 drinks   Drug  use: No    Family History  Problem Relation Age of Onset   Dementia Mother    Prostate cancer Father    Prostate cancer Brother    Cancer Brother    Hyperlipidemia Brother    Cancer Brother    Hyperlipidemia Brother    Sudden death Neg Hx    Hypertension Neg Hx    Heart attack Neg Hx    Diabetes Neg Hx    Colon cancer Neg Hx    Esophageal cancer Neg Hx    Stomach cancer Neg Hx    Rectal cancer Neg Hx     Allergies  Allergen Reactions   Quinolones     Patient with ascending thoracic aortic aneurysm (ATAA) so should avoid this class of antibiotic     Medication list has been reviewed and updated.  Current Outpatient Medications on File Prior to Visit   Medication Sig Dispense Refill   ezetimibe  (ZETIA ) 10 MG tablet Take 1 tablet (10 mg total) by mouth daily. 90 tablet 3   rosuvastatin  (CRESTOR ) 40 MG tablet TAKE 1 TABLET BY MOUTH EVERY DAY 90 tablet 3   No current facility-administered medications on file prior to visit.    Review of Systems:  As per HPI- otherwise negative.    Physical Examination: There were no vitals filed for this visit. There were no vitals filed for this visit. There is no height or weight on file to calculate BMI. Ideal Body Weight:    GEN: no acute distress. HEENT: Atraumatic, Normocephalic.  Ears and Nose: No external deformity. CV: RRR, No M/G/R. No JVD. No thrill. No extra heart sounds. PULM: CTA B, no wheezes, crackles, rhonchi. No retractions. No resp. distress. No accessory muscle use. ABD: S, NT, ND, +BS. No rebound. No HSM. EXTR: No c/c/e PSYCH: Normally interactive. Conversant.    Assessment and Plan: No diagnosis found.  Assessment & Plan   Signed Harlene Schroeder, MD

## 2024-09-02 NOTE — Patient Instructions (Incomplete)
 It was great to see you today, I will be in touch with your labs as soon as possible If not done already recommend 1 dose of RSV vaccine.  Also recommend a COVID booster this fall as well as usual flu shot  Assuming all is well please see me in about 6 months

## 2024-09-03 ENCOUNTER — Encounter: Payer: Self-pay | Admitting: Family Medicine

## 2024-09-03 ENCOUNTER — Ambulatory Visit (INDEPENDENT_AMBULATORY_CARE_PROVIDER_SITE_OTHER): Admitting: Family Medicine

## 2024-09-03 VITALS — BP 126/74 | HR 54 | Temp 97.9°F | Ht 71.0 in | Wt 175.6 lb

## 2024-09-03 DIAGNOSIS — E785 Hyperlipidemia, unspecified: Secondary | ICD-10-CM | POA: Diagnosis not present

## 2024-09-03 DIAGNOSIS — R7303 Prediabetes: Secondary | ICD-10-CM | POA: Diagnosis not present

## 2024-09-03 DIAGNOSIS — Z5181 Encounter for therapeutic drug level monitoring: Secondary | ICD-10-CM | POA: Diagnosis not present

## 2024-09-03 DIAGNOSIS — Z8546 Personal history of malignant neoplasm of prostate: Secondary | ICD-10-CM | POA: Diagnosis not present

## 2024-09-03 DIAGNOSIS — Z23 Encounter for immunization: Secondary | ICD-10-CM

## 2024-09-03 DIAGNOSIS — R931 Abnormal findings on diagnostic imaging of heart and coronary circulation: Secondary | ICD-10-CM

## 2024-09-03 DIAGNOSIS — I7121 Aneurysm of the ascending aorta, without rupture: Secondary | ICD-10-CM | POA: Diagnosis not present

## 2024-09-03 LAB — COMPREHENSIVE METABOLIC PANEL WITH GFR
ALT: 30 U/L (ref 0–53)
AST: 34 U/L (ref 0–37)
Albumin: 4.4 g/dL (ref 3.5–5.2)
Alkaline Phosphatase: 72 U/L (ref 39–117)
BUN: 13 mg/dL (ref 6–23)
CO2: 31 meq/L (ref 19–32)
Calcium: 9.5 mg/dL (ref 8.4–10.5)
Chloride: 101 meq/L (ref 96–112)
Creatinine, Ser: 1.01 mg/dL (ref 0.40–1.50)
GFR: 69.35 mL/min (ref >60.00)
Glucose, Bld: 87 mg/dL (ref 70–99)
Potassium: 4.4 meq/L (ref 3.5–5.1)
Sodium: 137 meq/L (ref 135–145)
Total Bilirubin: 0.9 mg/dL (ref 0.2–1.2)
Total Protein: 6.6 g/dL (ref 6.0–8.3)

## 2024-09-03 LAB — CBC
HCT: 41.8 % (ref 39.0–52.0)
Hemoglobin: 13.7 g/dL (ref 13.0–17.0)
MCHC: 32.8 g/dL (ref 30.0–36.0)
MCV: 92.2 fl (ref 78.0–100.0)
Platelets: 177 K/uL (ref 150.0–400.0)
RBC: 4.53 Mil/uL (ref 4.22–5.81)
RDW: 13.3 % (ref 11.5–15.5)
WBC: 4.1 K/uL (ref 4.0–10.5)

## 2024-09-03 LAB — HEMOGLOBIN A1C: Hgb A1c MFr Bld: 6.2 % (ref 4.6–6.5)

## 2024-09-03 LAB — PSA: PSA: 0 ng/mL — ABNORMAL LOW (ref 0.10–4.00)

## 2024-09-17 ENCOUNTER — Other Ambulatory Visit: Payer: Self-pay | Admitting: Thoracic Surgery (Cardiothoracic Vascular Surgery)

## 2024-09-17 DIAGNOSIS — I7121 Aneurysm of the ascending aorta, without rupture: Secondary | ICD-10-CM

## 2024-10-15 DIAGNOSIS — L821 Other seborrheic keratosis: Secondary | ICD-10-CM | POA: Diagnosis not present

## 2024-10-15 DIAGNOSIS — L578 Other skin changes due to chronic exposure to nonionizing radiation: Secondary | ICD-10-CM | POA: Diagnosis not present

## 2024-10-15 DIAGNOSIS — Z85828 Personal history of other malignant neoplasm of skin: Secondary | ICD-10-CM | POA: Diagnosis not present

## 2024-10-15 DIAGNOSIS — D692 Other nonthrombocytopenic purpura: Secondary | ICD-10-CM | POA: Diagnosis not present

## 2024-10-15 DIAGNOSIS — L858 Other specified epidermal thickening: Secondary | ICD-10-CM | POA: Diagnosis not present

## 2024-10-24 ENCOUNTER — Ambulatory Visit (HOSPITAL_COMMUNITY)
Admission: RE | Admit: 2024-10-24 | Discharge: 2024-10-24 | Disposition: A | Discharge status: Home / Self Care | Source: Ambulatory Visit | Attending: Thoracic Surgery (Cardiothoracic Vascular Surgery) | Admitting: Thoracic Surgery (Cardiothoracic Vascular Surgery)

## 2024-10-24 DIAGNOSIS — J9811 Atelectasis: Secondary | ICD-10-CM | POA: Diagnosis not present

## 2024-10-24 DIAGNOSIS — I7121 Aneurysm of the ascending aorta, without rupture: Secondary | ICD-10-CM | POA: Diagnosis not present

## 2024-10-24 MED ORDER — IOHEXOL 350 MG/ML SOLN
75.0000 mL | Freq: Once | INTRAVENOUS | Status: AC | PRN
  Administered 2024-10-24: 75 mL via INTRAVENOUS

## 2024-11-03 NOTE — Progress Notes (Unsigned)
 [address] [place] [address]             [phone]            Parker Harrison [id number] 1941-09-18   History of Present Illness:  Parker Harrison is a 83 year old man with medical history of coronary calcifications, prostate cancer and hyperlipidemia who presents for continued follow up of ascending thoracic aortic aneurysm.  Aneurysm has measured form 4.5 cm -4.7 cm over the past years.  On recent CTA of chest aneurysm measured 4.6 cm.  Also the 3 mm right middle lobe lung nodule was unchanged.   He presents to the clinic today reports that he has been doing well.  His blood pressure is well-controlled without the current use of any medications.  He is very active and plays tennis 3 times a week, walks and also does some light weightlifting exercises.  His chest pain, shortness of breath and lower leg edema.     Current Outpatient Medications on File Prior to Visit  Medication Sig Dispense Refill   ezetimibe  (ZETIA ) 10 MG tablet Take 1 tablet (10 mg total) by mouth daily. 90 tablet 3   rosuvastatin  (CRESTOR ) 40 MG tablet TAKE 1 TABLET BY MOUTH EVERY DAY 90 tablet 3   No current facility-administered medications on file prior to visit.     ROS: Review of Systems  Constitutional:  Negative for malaise/fatigue.  Respiratory: Negative.  Negative for cough and shortness of breath.   Cardiovascular: Negative.  Negative for chest pain, palpitations and leg swelling.     BP 116/62 (BP Location: Left Arm)   Pulse (!) 55   Resp 18   Ht 5' 11 (1.803 m)   Wt 178 lb (80.7 kg)   SpO2 95%   BMI 24.83 kg/m   Physical Exam Constitutional:      Appearance: Normal appearance.  HENT:     Head: Normocephalic and atraumatic.  Cardiovascular:     Rate and Rhythm: Normal rate and regular rhythm.     Heart sounds: Normal heart sounds, S1 normal and S2 normal.  Pulmonary:     Effort: Pulmonary effort is normal.     Breath sounds: Normal breath  sounds.  Skin:    General: Skin is warm and dry.  Neurological:     General: No focal deficit present.     Mental Status: He is alert and oriented to person, place, and time.      Imaging: EXAM: CTA CHEST AORTA 10/24/2024 02:58:28 PM   TECHNIQUE: CTA of the chest was performed after the administration of 75 mL of iohexol  (OMNIPAQUE ) 350 MG/ML injection. Multiplanar reformatted images are provided for review. MIP images are provided for review. Automated exposure control, iterative reconstruction, and/or weight based adjustment of the mA/kV was utilized to reduce the radiation dose to as low as reasonably achievable.   COMPARISON: 04/10/2024, 10/12/2023   CLINICAL HISTORY: Aortic aneurysm suspected.   FINDINGS:   AORTA: Redemonstrated fusiform ascending aortic aneurysm measuring 4.6 cm (previously, 4.5 cm). No aortic dissection. Conventional 3 vessel aortic arch anatomy. Scattered aortic atherosclerosis. No periaortic fluid collection or inflammation.   MEDIASTINUM: No mediastinal lymphadenopathy. The heart and pericardium demonstrate no acute abnormality.   LYMPH NODES: No mediastinal, hilar or axillary lymphadenopathy.   LUNGS AND PLEURA: Posterior bibasilar dependent atelectasis. Similar appearance of a few clustered nodules in the right middle lobe measuring up to 3 mm,  likely infectious or inflammatory. Biapical pleuroparenchymal scarring. No pleural effusion or pneumothorax.   UPPER ABDOMEN: Nonobstructive calculus in the upper pole of the right kidney.   SOFT TISSUES AND BONES: Severe osteoarthritis of the right glenohumeral joint. Multilevel degenerative disc disease of the spine. Osteopenia. No acute soft tissue abnormality.   IMPRESSION: 1. Redemonstrated fusiform ascending aortic aneurysm measuring 4.6 cm, unchanged. No aortic dissection. 2. No pneumonia, pulmonary edema, or pleural effusion.   Electronically signed by: Parker Myers MD  10/24/2024 03:33 PM EST RP Workstation: [id number]     A/P: Aneurysm of ascending aorta without rupture -4.6 cm ascending thoracic aortic aneurysm on CTA of chest. -We discussed the natural history and and risk factors for growth of ascending aortic aneurysms. Discussed recommendations to minimize the risk of further expansion or dissection including careful blood pressure control, avoidance of contact sports and heavy lifting, attention to lipid management.  We covered the importance of staying never user of tobacco.  The patient does not yet meet surgical criteria of >5.5cm. The patient is aware of signs and symptoms of aortic dissection and when to present to the emergency department     -Follow up in one year with CTA of chest for continued surveillance   Risk Modification:  Statin:  rosuvastatin   Smoking cessation instruction/counseling given:  never user  Patient was counseled on importance of Blood Pressure Control  They are instructed to contact their Primary Care Physician if they start to have blood pressure readings over 130s/90s. Do not ever stop blood pressure medications on your own, unless instructed by healthcare professional.  Please avoid use of Fluoroquinolones as this can potentially increase your risk of Aortic Rupture and/or Dissection  Patient educated on signs and symptoms of Aortic Dissection, handout also provided in AVS  Parker CHRISTELLA Rough, PA-C 11/04/24

## 2024-11-04 ENCOUNTER — Ambulatory Visit

## 2024-11-04 VITALS — BP 116/62 | HR 55 | Resp 18 | Ht 71.0 in | Wt 178.0 lb

## 2024-11-04 DIAGNOSIS — I7121 Aneurysm of the ascending aorta, without rupture: Secondary | ICD-10-CM

## 2024-11-04 NOTE — Patient Instructions (Addendum)
 Risk Modification in those with ascending thoracic aortic aneurysm:   Continue control of blood pressure (prefer BP 130/80 or less)   2. Avoid fluoroquinolone antibiotics (I.e Ciprofloxacin, Avelox, Levofloxacin, Ofloxacin)   3.  Use of statin (to decrease cardiovascular risk)   4.  Exercise and activity limitations is individualized, but in general, contact sports are to be avoided and one should avoid heavy lifting (defined as half of ideal body weight) and exercises involving sustained Valsalva maneuver.   5.  Follow-up in one year with CTA chest.  OK to use a non-contrast CT if you have had a recent study for surveillance of the lung nodule.

## 2024-11-15 ENCOUNTER — Other Ambulatory Visit: Payer: Self-pay | Admitting: Cardiology

## 2024-11-15 DIAGNOSIS — R931 Abnormal findings on diagnostic imaging of heart and coronary circulation: Secondary | ICD-10-CM

## 2024-11-15 DIAGNOSIS — E785 Hyperlipidemia, unspecified: Secondary | ICD-10-CM

## 2025-03-04 ENCOUNTER — Ambulatory Visit: Admitting: Family Medicine
